# Patient Record
Sex: Male | Born: 1970 | Hispanic: No | Marital: Single | Smoking: Former smoker
Health system: Southern US, Community
[De-identification: ages and names within clinical notes are randomized; demographics above are authoritative.]

## PROBLEM LIST (undated history)

## (undated) DIAGNOSIS — B169 Acute hepatitis B without delta-agent and without hepatic coma: Secondary | ICD-10-CM

## (undated) HISTORY — PX: NASAL SINUS SURGERY: SHX719

## (undated) HISTORY — DX: Acute hepatitis B without delta-agent and without hepatic coma: B16.9

---

## 2000-12-16 ENCOUNTER — Other Ambulatory Visit: Admission: RE | Admit: 2000-12-16 | Discharge: 2000-12-16 | Payer: Self-pay | Admitting: Otolaryngology

## 2004-05-09 ENCOUNTER — Ambulatory Visit: Payer: Self-pay | Admitting: Family Medicine

## 2004-05-14 ENCOUNTER — Ambulatory Visit: Payer: Self-pay | Admitting: Family Medicine

## 2004-05-19 ENCOUNTER — Ambulatory Visit: Payer: Self-pay | Admitting: Family Medicine

## 2004-11-19 ENCOUNTER — Ambulatory Visit: Payer: Self-pay | Admitting: Family Medicine

## 2017-10-19 ENCOUNTER — Encounter: Payer: Self-pay | Admitting: Gastroenterology

## 2017-11-15 ENCOUNTER — Encounter: Payer: Self-pay | Admitting: Gastroenterology

## 2017-11-25 ENCOUNTER — Ambulatory Visit: Payer: Self-pay | Admitting: Gastroenterology

## 2020-04-23 ENCOUNTER — Emergency Department (HOSPITAL_COMMUNITY): Payer: 59

## 2020-04-23 ENCOUNTER — Encounter (HOSPITAL_COMMUNITY): Payer: Self-pay | Admitting: Emergency Medicine

## 2020-04-23 ENCOUNTER — Inpatient Hospital Stay (HOSPITAL_COMMUNITY)
Admission: EM | Admit: 2020-04-23 | Discharge: 2020-05-04 | DRG: 177 | Disposition: A | Discharge status: Home / Self Care | Payer: 59 | Attending: Internal Medicine | Admitting: Internal Medicine

## 2020-04-23 DIAGNOSIS — Z8619 Personal history of other infectious and parasitic diseases: Secondary | ICD-10-CM | POA: Diagnosis not present

## 2020-04-23 DIAGNOSIS — J1282 Pneumonia due to coronavirus disease 2019: Secondary | ICD-10-CM

## 2020-04-23 DIAGNOSIS — A0839 Other viral enteritis: Secondary | ICD-10-CM | POA: Diagnosis present

## 2020-04-23 DIAGNOSIS — E785 Hyperlipidemia, unspecified: Secondary | ICD-10-CM | POA: Diagnosis present

## 2020-04-23 DIAGNOSIS — R410 Disorientation, unspecified: Secondary | ICD-10-CM | POA: Diagnosis not present

## 2020-04-23 DIAGNOSIS — E669 Obesity, unspecified: Secondary | ICD-10-CM | POA: Diagnosis present

## 2020-04-23 DIAGNOSIS — F22 Delusional disorders: Secondary | ICD-10-CM | POA: Diagnosis present

## 2020-04-23 DIAGNOSIS — Z79899 Other long term (current) drug therapy: Secondary | ICD-10-CM

## 2020-04-23 DIAGNOSIS — U071 COVID-19: Principal | ICD-10-CM | POA: Diagnosis present

## 2020-04-23 DIAGNOSIS — E876 Hypokalemia: Secondary | ICD-10-CM | POA: Diagnosis present

## 2020-04-23 DIAGNOSIS — F419 Anxiety disorder, unspecified: Secondary | ICD-10-CM | POA: Diagnosis present

## 2020-04-23 DIAGNOSIS — J9601 Acute respiratory failure with hypoxia: Secondary | ICD-10-CM | POA: Diagnosis present

## 2020-04-23 DIAGNOSIS — I10 Essential (primary) hypertension: Secondary | ICD-10-CM | POA: Diagnosis present

## 2020-04-23 DIAGNOSIS — Z6829 Body mass index (BMI) 29.0-29.9, adult: Secondary | ICD-10-CM | POA: Diagnosis not present

## 2020-04-23 DIAGNOSIS — Z9114 Patient's other noncompliance with medication regimen: Secondary | ICD-10-CM

## 2020-04-23 DIAGNOSIS — R0603 Acute respiratory distress: Secondary | ICD-10-CM

## 2020-04-23 DIAGNOSIS — F29 Unspecified psychosis not due to a substance or known physiological condition: Secondary | ICD-10-CM | POA: Diagnosis not present

## 2020-04-23 DIAGNOSIS — Z5329 Procedure and treatment not carried out because of patient's decision for other reasons: Secondary | ICD-10-CM | POA: Diagnosis not present

## 2020-04-23 DIAGNOSIS — R7989 Other specified abnormal findings of blood chemistry: Secondary | ICD-10-CM | POA: Diagnosis not present

## 2020-04-23 LAB — COMPREHENSIVE METABOLIC PANEL
ALT: 39 U/L (ref 0–44)
AST: 42 U/L — ABNORMAL HIGH (ref 15–41)
Albumin: 3.2 g/dL — ABNORMAL LOW (ref 3.5–5.0)
Alkaline Phosphatase: 52 U/L (ref 38–126)
Anion gap: 15 (ref 5–15)
BUN: 17 mg/dL (ref 6–20)
CO2: 19 mmol/L — ABNORMAL LOW (ref 22–32)
Calcium: 8 mg/dL — ABNORMAL LOW (ref 8.9–10.3)
Chloride: 98 mmol/L (ref 98–111)
Creatinine, Ser: 1.16 mg/dL (ref 0.61–1.24)
GFR, Estimated: >60 mL/min (ref >60)
Glucose, Bld: 176 mg/dL — ABNORMAL HIGH (ref 70–99)
Potassium: 3.3 mmol/L — ABNORMAL LOW (ref 3.5–5.1)
Sodium: 132 mmol/L — ABNORMAL LOW (ref 135–145)
Total Bilirubin: 0.7 mg/dL (ref 0.3–1.2)
Total Protein: 6.4 g/dL — ABNORMAL LOW (ref 6.5–8.1)

## 2020-04-23 LAB — CBC WITH DIFFERENTIAL/PLATELET
Abs Immature Granulocytes: 0.05 10*3/uL (ref 0.00–0.07)
Basophils Absolute: 0 10*3/uL (ref 0.0–0.1)
Basophils Relative: 0 %
Eosinophils Absolute: 0 10*3/uL (ref 0.0–0.5)
Eosinophils Relative: 0 %
HCT: 41.3 % (ref 39.0–52.0)
Hemoglobin: 13.5 g/dL (ref 13.0–17.0)
Immature Granulocytes: 1 %
Lymphocytes Relative: 10 %
Lymphs Abs: 0.8 10*3/uL (ref 0.7–4.0)
MCH: 27.7 pg (ref 26.0–34.0)
MCHC: 32.7 g/dL (ref 30.0–36.0)
MCV: 84.6 fL (ref 80.0–100.0)
Monocytes Absolute: 0.2 10*3/uL (ref 0.1–1.0)
Monocytes Relative: 3 %
Neutro Abs: 6.4 10*3/uL (ref 1.7–7.7)
Neutrophils Relative %: 86 %
Platelets: 151 10*3/uL (ref 150–400)
RBC: 4.88 MIL/uL (ref 4.22–5.81)
RDW: 13.2 % (ref 11.5–15.5)
WBC: 7.4 10*3/uL (ref 4.0–10.5)
nRBC: 0 % (ref 0.0–0.2)

## 2020-04-23 LAB — PROCALCITONIN: Procalcitonin: 0.1 ng/mL

## 2020-04-23 LAB — D-DIMER, QUANTITATIVE: D-Dimer, Quant: 0.46 ug/mL-FEU (ref 0.00–0.50)

## 2020-04-23 LAB — C-REACTIVE PROTEIN: CRP: 6.8 mg/dL — ABNORMAL HIGH (ref <1.0)

## 2020-04-23 LAB — TRIGLYCERIDES: Triglycerides: 186 mg/dL — ABNORMAL HIGH (ref <150)

## 2020-04-23 LAB — LACTIC ACID, PLASMA: Lactic Acid, Venous: 2.9 mmol/L (ref 0.5–1.9)

## 2020-04-23 LAB — FERRITIN: Ferritin: 1089 ng/mL — ABNORMAL HIGH (ref 24–336)

## 2020-04-23 LAB — FIBRINOGEN: Fibrinogen: 557 mg/dL — ABNORMAL HIGH (ref 210–475)

## 2020-04-23 LAB — HIV ANTIBODY (ROUTINE TESTING W REFLEX): HIV Screen 4th Generation wRfx: NONREACTIVE

## 2020-04-23 LAB — LACTATE DEHYDROGENASE: LDH: 322 U/L — ABNORMAL HIGH (ref 98–192)

## 2020-04-23 LAB — POC SARS CORONAVIRUS 2 AG -  ED: SARS Coronavirus 2 Ag: POSITIVE — AB

## 2020-04-23 MED ORDER — METHYLPREDNISOLONE SODIUM SUCC 125 MG IJ SOLR
0.5000 mg/kg | Freq: Two times a day (BID) | INTRAMUSCULAR | Status: AC
  Administered 2020-04-23 – 2020-04-26 (×6): 43.125 mg via INTRAVENOUS
  Filled 2020-04-23 (×5): qty 2

## 2020-04-23 MED ORDER — PREDNISONE 20 MG PO TABS
50.0000 mg | ORAL_TABLET | Freq: Every day | ORAL | Status: DC
  Filled 2020-04-23: qty 2

## 2020-04-23 MED ORDER — DEXAMETHASONE SODIUM PHOSPHATE 10 MG/ML IJ SOLN
10.0000 mg | Freq: Once | INTRAMUSCULAR | Status: DC
  Filled 2020-04-23: qty 1

## 2020-04-23 MED ORDER — BENAZEPRIL HCL 5 MG PO TABS
10.0000 mg | ORAL_TABLET | Freq: Every day | ORAL | Status: DC
  Administered 2020-04-24 – 2020-04-25 (×2): 10 mg via ORAL
  Filled 2020-04-23 (×5): qty 2

## 2020-04-23 MED ORDER — ACETAMINOPHEN 325 MG PO TABS
650.0000 mg | ORAL_TABLET | Freq: Once | ORAL | Status: AC
  Administered 2020-04-23: 650 mg via ORAL
  Filled 2020-04-23: qty 2

## 2020-04-23 MED ORDER — SODIUM CHLORIDE 0.9 % IV SOLN: 100.0000 mg | Freq: Every day | INTRAVENOUS | Status: DC

## 2020-04-23 MED ORDER — GUAIFENESIN-DM 100-10 MG/5ML PO SYRP
10.0000 mL | ORAL_SOLUTION | ORAL | Status: DC | PRN
  Administered 2020-04-27 – 2020-05-04 (×8): 10 mL via ORAL
  Filled 2020-04-23 (×9): qty 10

## 2020-04-23 MED ORDER — ONDANSETRON HCL 4 MG/2ML IJ SOLN
4.0000 mg | Freq: Four times a day (QID) | INTRAMUSCULAR | Status: DC | PRN
  Administered 2020-04-25: 4 mg via INTRAVENOUS
  Filled 2020-04-23: qty 2

## 2020-04-23 MED ORDER — BENZONATATE 100 MG PO CAPS
200.0000 mg | ORAL_CAPSULE | Freq: Once | ORAL | Status: AC
  Administered 2020-04-23: 200 mg via ORAL
  Filled 2020-04-23: qty 2

## 2020-04-23 MED ORDER — HYDROCOD POLST-CPM POLST ER 10-8 MG/5ML PO SUER
5.0000 mL | Freq: Two times a day (BID) | ORAL | Status: DC | PRN
  Administered 2020-04-29 (×2): 5 mL via ORAL
  Filled 2020-04-23 (×2): qty 5

## 2020-04-23 MED ORDER — POTASSIUM CHLORIDE CRYS ER 20 MEQ PO TBCR
40.0000 meq | EXTENDED_RELEASE_TABLET | Freq: Once | ORAL | Status: AC
  Administered 2020-04-23: 40 meq via ORAL
  Filled 2020-04-23: qty 2

## 2020-04-23 MED ORDER — SODIUM CHLORIDE 0.9 % IV SOLN
200.0000 mg | Freq: Once | INTRAVENOUS | Status: DC
  Filled 2020-04-23: qty 40

## 2020-04-23 MED ORDER — SODIUM CHLORIDE 0.9 % IV SOLN: 200.0000 mg | Freq: Once | INTRAVENOUS | Status: DC

## 2020-04-23 MED ORDER — ENOXAPARIN SODIUM 40 MG/0.4ML ~~LOC~~ SOLN
40.0000 mg | SUBCUTANEOUS | Status: DC
  Administered 2020-04-23 – 2020-04-28 (×6): 40 mg via SUBCUTANEOUS
  Filled 2020-04-23 (×5): qty 0.4

## 2020-04-23 MED ORDER — ONDANSETRON HCL 4 MG PO TABS: 4.0000 mg | ORAL_TABLET | Freq: Four times a day (QID) | ORAL | Status: DC | PRN

## 2020-04-23 MED ORDER — ATORVASTATIN CALCIUM 40 MG PO TABS
40.0000 mg | ORAL_TABLET | Freq: Every day | ORAL | Status: DC
  Administered 2020-04-25 – 2020-05-03 (×9): 40 mg via ORAL
  Filled 2020-04-23 (×10): qty 1

## 2020-04-23 MED ORDER — ALBUTEROL SULFATE HFA 108 (90 BASE) MCG/ACT IN AERS
4.0000 | INHALATION_SPRAY | Freq: Once | RESPIRATORY_TRACT | Status: AC
  Administered 2020-04-23: 4 via RESPIRATORY_TRACT
  Filled 2020-04-23: qty 6.7

## 2020-04-23 MED ORDER — SODIUM CHLORIDE 0.9 % IV SOLN
100.0000 mg | Freq: Every day | INTRAVENOUS | Status: DC
  Filled 2020-04-23: qty 20

## 2020-04-23 MED ORDER — ACETAMINOPHEN 325 MG PO TABS
650.0000 mg | ORAL_TABLET | Freq: Four times a day (QID) | ORAL | Status: DC | PRN
  Administered 2020-04-29: 650 mg via ORAL
  Filled 2020-04-23: qty 2

## 2020-04-23 MED ORDER — AMLODIPINE BESYLATE 5 MG PO TABS
5.0000 mg | ORAL_TABLET | Freq: Every day | ORAL | Status: DC
  Administered 2020-04-23 – 2020-05-04 (×12): 5 mg via ORAL
  Filled 2020-04-23 (×12): qty 1

## 2020-04-23 NOTE — ED Notes (Signed)
Monitor and leads replaced, pt placed on cardiac monitor, BP cuff, and pulse ox.

## 2020-04-23 NOTE — ED Notes (Signed)
Decadron and remdesvir prepared and brought to bedside. Patient verbalized that he is not interested in either medication at this time. RN attempted to respond to his concerns. At the end of the conversation, patient encouraged to call wife to see if she has gotten a dose and what her thoughts are. Will let provider know.

## 2020-04-23 NOTE — H&P (Signed)
History and Physical    Frank Richmond:[id number] DOB: 1970-08-26 DOA: 04/23/2020  PCP: Pcp, No  Patient coming from: Home  I have personally briefly reviewed patient's old medical records in [facility]  Chief Complaint: Shortness of breath, cough and fever  HPI: Frank Richmond is a 50 y.o. male with medical history significant of hypertension, hyperlipidemia, remote history of hepatitis B and recent COVID infection presented to ED with a complaint of cough, fatigue, shortness of breath, fevers and chills.  Patient started his symptoms on January 1.  He presented himself to [facility] on January 8 where he was tested positive for COVID-19 and was admitted.  He then left AMA on January 10 due to having paranoid thoughts that he was getting some sort of, medication mixed in the oxygen.  He then started having some diarrhea which started yesterday but improved somewhat today.  He also has some chest pain with a deep breathing.  Per patient, he had CAT scan of the chest done in [facility] and he was ruled out of PE.  Also endorses some chills and sweating as well as some subjective fever at home.  ED Course: Upon arrival to ED here, he was tachycardic, tachypneic and was saturating 86% on room air requiring 2 L of oxygen.  He was once again tested positive for COVID-19.  Lactic acid elevated at 2.9.  Elevated inflammatory markers but unremarkable procalcitonin.  Interestingly, his D-dimer is also within normal range.  Patient was started on steroids and remdesivir and hospital service were consulted to admit the patient for further management.  ED provider is working on getting records from [facility]  Review of Systems: As per HPI otherwise negative.    Past Medical History:  Diagnosis Date  . Acute hepatitis B    s/p resolution (wife from [place] is a carrier)     Past Surgical History:  Procedure Laterality Date  . NASAL SINUS SURGERY       has no history on  file for tobacco use, alcohol use, and drug use.  No Known Allergies  History reviewed. No pertinent family history.  Prior to Admission medications   Medication Sig Start Date End Date Taking? Authorizing Provider  amLODipine (NORVASC) 5 MG tablet Take 5 mg by mouth daily. 03/20/20  Yes [provider]  atorvastatin (LIPITOR) 40 MG tablet Take 40 mg by mouth at bedtime. 03/11/20  Yes [provider]  benazepril (LOTENSIN) 10 MG tablet Take 10 mg by mouth daily. 03/20/20  Yes [provider]    Physical Exam: Vitals:   04/23/20 1615 04/23/20 1630 04/23/20 1645 04/23/20 1700  BP: 120/76 114/75 123/76 128/78  Pulse: (!) 102 89 94 (!) 101  Resp: (!) 29 (!) 24 (!) 24 20  Temp:      TempSrc:      SpO2: 93% 93% 95% 96%  Weight:      Height:        Constitutional: NAD, calm, comfortable Vitals:   04/23/20 1615 04/23/20 1630 04/23/20 1645 04/23/20 1700  BP: 120/76 114/75 123/76 128/78  Pulse: (!) 102 89 94 (!) 101  Resp: (!) 29 (!) 24 (!) 24 20  Temp:      TempSrc:      SpO2: 93% 93% 95% 96%  Weight:      Height:       Eyes: PERRL, lids and conjunctivae normal ENMT: Mucous membranes are moist. Posterior pharynx clear of any exudate or lesions.Normal  dentition.  Neck: normal, supple, no masses, no thyromegaly Respiratory: Diminished breath sounds bilaterally, no wheezing, no crackles. Normal respiratory effort. No accessory muscle use.  Cardiovascular: Regular rate and rhythm, no murmurs / rubs / gallops. No extremity edema. 2+ pedal pulses. No carotid bruits.  Abdomen: no tenderness, no masses palpated. No hepatosplenomegaly. Bowel sounds positive.  Musculoskeletal: no clubbing / cyanosis. No joint deformity upper and lower extremities. Good ROM, no contractures. Normal muscle tone.  Skin: no rashes, lesions, ulcers. No induration Neurologic: CN 2-12 grossly intact. Sensation intact, DTR normal. Strength 5/5 in all 4.  Psychiatric: Normal judgment and  insight. Alert and oriented x 3. Normal mood.    Labs on Admission: I have personally reviewed following labs and imaging studies  CBC: Recent Labs  Lab 04/23/20 1414  WBC 7.4  NEUTROABS 6.4  HGB 13.5  HCT 41.3  MCV 84.6  PLT 151   Basic Metabolic Panel: Recent Labs  Lab 04/23/20 1414  NA 132*  K 3.3*  CL 98  CO2 19*  GLUCOSE 176*  BUN 17  CREATININE 1.16  CALCIUM 8.0*   GFR: Estimated Creatinine Clearance: 79 mL/min (by C-G formula based on SCr of 1.16 mg/dL). Liver Function Tests: Recent Labs  Lab 04/23/20 1414  AST 42*  ALT 39  ALKPHOS 52  BILITOT 0.7  PROT 6.4*  ALBUMIN 3.2*   No results for input(s): LIPASE, AMYLASE in the last 168 hours. No results for input(s): AMMONIA in the last 168 hours. Coagulation Profile: No results for input(s): INR, PROTIME in the last 168 hours. Cardiac Enzymes: No results for input(s): CKTOTAL, CKMB, CKMBINDEX, TROPONINI in the last 168 hours. BNP (last 3 results) No results for input(s): PROBNP in the last 8760 hours. HbA1C: No results for input(s): HGBA1C in the last 72 hours. CBG: No results for input(s): GLUCAP in the last 168 hours. Lipid Profile: Recent Labs    04/23/20 1414  TRIG 186*   Thyroid Function Tests: No results for input(s): TSH, T4TOTAL, FREET4, T3FREE, THYROIDAB in the last 72 hours. Anemia Panel: Recent Labs    04/23/20 1414  FERRITIN 1,089*   Urine analysis: No results found for: COLORURINE, APPEARANCEUR, LABSPEC, PHURINE, GLUCOSEU, HGBUR, BILIRUBINUR, KETONESUR, PROTEINUR, UROBILINOGEN, NITRITE, LEUKOCYTESUR  Radiological Exams on Admission: DG Chest Port 1 View  Result Date: 04/23/2020 CLINICAL DATA:  Shortness of breath COVID Cough EXAM: PORTABLE CHEST 1 VIEW COMPARISON:  04/21/2020 FINDINGS: Cardiomediastinal silhouette and pulmonary vasculature are within normal limits. Patchy bilateral lung opacities have improved since prior examination slight are not completely resolved.  IMPRESSION: Improving bilateral multifocal pneumonia. Electronically Signed   By: Acquanetta Belling M.D.   On: 04/23/2020 13:38    Assessment/Plan Active Problems:   Pneumonia due to COVID-19 virus   Acute hypoxic respiratory failure secondary to COVID-19 pneumonia: We will continue this patient with remdesivir as well as Solu-Medrol, continue oxygen supply to keep oxygen saturation more than 88%,Patient was encouraged to prone, out of bed to chair, to use incentive spirometry and flutter valve.  We will repeat inflammatory markers tomorrow.  The treatment plan and use of medications and known side effects were discussed with patient/family, they were clearly explained that there is no proven definitive treatment for COVID-19 infection, any medications used here are based on published clinical articles/anecdotal data which are not peer-reviewed or randomized control trials.  Complete risks and long-term side effects are unknown, however in the best clinical judgment they seem to be of some clinical benefit rather than medical  risks.  Patient/family agree with the treatment plan and want to receive the given medications.  Hypertension: Blood pressure controlled.  Resume home medications.  Hyperlipidemia: Resume home statin.  COVID management My COVID management  DVT prophylaxis: enoxaparin (LOVENOX) injection 40 mg Start: 04/23/20 1800 Code Status: Full code Family Communication: None present at bedside.  Plan of care discussed with patient in length and he verbalized understanding and agreed with it. Disposition Plan: Home once medically improved Consults called: None Admission status: Inpatient  Hughie Closs MD [facility]  04/23/2020, 6:11 PM  To contact the attending provider between 7A-7P or the covering provider during after hours 7P-7A, please log into the web site [email or web address]

## 2020-04-23 NOTE — ED Notes (Signed)
Covid positive with recent AMA from [facility]. He didn't like the temp of the room, , broken call bell, isolation, and suspects the oxygen was fake that they were giving him. Patient placed on monitors and working call bell within reach. Patient room temp adjusted to his satisfaction and warm blanket provided. Side rails up x 2 and bed in low and locked position.

## 2020-04-23 NOTE — ED Notes (Signed)
No leads or monitor in room. Pt brought to room.

## 2020-04-23 NOTE — ED Provider Notes (Signed)
Care assumed from [facility], please see his note for full details, but in brief patient with known COVID infection with symptoms beginning on 04/13/20, was seen and admitted to [facility] a few days ago and was on oxygen, but reports he did not feel that the oxygen was helping him there, so he decided to leave AMA, but upon returning home symptoms continue to worsen with worsening shortness of breath. On arrival patient found to be hypoxic to 86% at rest. Improved with 2 L O2.  Will require admission for COVID, chest x-ray shows bilateral COVID-pneumonia.  Lab work is pending.  Remdesivir and steroids ordered for patient.  He is currently remaining stable on 2 L nasal cannula.  BP 126/74   Pulse (!) 102   Temp 98.2 F (36.8 C) (Oral)   Resp (!) 29   Ht 5\' 7"  (1.702 m)   Wt 86.2 kg   SpO2 95%   BMI 29.76 kg/m    ED Course/Procedures   Labs Reviewed  LACTIC ACID, PLASMA - Abnormal; Notable for the following components:      Result Value   Lactic Acid, Venous 2.9 (*)    All other components within normal limits  COMPREHENSIVE METABOLIC PANEL - Abnormal; Notable for the following components:   Sodium 132 (*)    Potassium 3.3 (*)    CO2 19 (*)    Glucose, Bld 176 (*)    Calcium 8.0 (*)    Total Protein 6.4 (*)    Albumin 3.2 (*)    AST 42 (*)    All other components within normal limits  LACTATE DEHYDROGENASE - Abnormal; Notable for the following components:   LDH 322 (*)    All other components within normal limits  TRIGLYCERIDES - Abnormal; Notable for the following components:   Triglycerides 186 (*)    All other components within normal limits  FIBRINOGEN - Abnormal; Notable for the following components:   Fibrinogen 557 (*)    All other components within normal limits  POC SARS CORONAVIRUS 2 AG -  ED - Abnormal; Notable for the following components:   SARS Coronavirus 2 Ag POSITIVE (*)    All other components within normal limits  CULTURE, BLOOD (ROUTINE X  2)  CULTURE, BLOOD (ROUTINE X 2)  CBC WITH DIFFERENTIAL/PLATELET  D-DIMER, QUANTITATIVE (NOT AT [facility])  LACTIC ACID, PLASMA  PROCALCITONIN  FERRITIN  C-REACTIVE PROTEIN  POC SARS CORONAVIRUS 2 AG -  ED   DG Chest Port 1 View  Result Date: 04/23/2020 CLINICAL DATA:  Shortness of breath COVID Cough EXAM: PORTABLE CHEST 1 VIEW COMPARISON:  04/21/2020 FINDINGS: Cardiomediastinal silhouette and pulmonary vasculature are within normal limits. Patchy bilateral lung opacities have improved since prior examination slight are not completely resolved. IMPRESSION: Improving bilateral multifocal pneumonia. Electronically Signed   By: 06/19/2020 M.D.   On: 04/23/2020 13:38   EKG Interpretation  Date/Time:  Tuesday April 23 2020 06:59:02 EST Ventricular Rate:  96 PR Interval:  130 QRS Duration: 78 QT Interval:  342 QTC Calculation: 432 R Axis:   47 Text Interpretation: Normal sinus rhythm Normal ECG Confirmed by [id number] (Marianna Fuss) on 04/23/2020 10:15:22 AM   .Critical Care Performed by: 06/21/2020, PA-C Authorized by: Dartha Lodge, PA-C   Critical care provider statement:    Critical care time (minutes):  45   Critical care time was exclusive of:  Teaching time and separately billable procedures and treating other patients   Critical care  was necessary to treat or prevent imminent or life-threatening deterioration of the following conditions:  Respiratory failure (COVID with acute hypoxic respiratory failure)   Critical care was time spent personally by me on the following activities:  Discussions with consultants, evaluation of patient's response to treatment, examination of patient, ordering and performing treatments and interventions, ordering and review of laboratory studies, ordering and review of radiographic studies, pulse oximetry, re-evaluation of patient's condition, obtaining history from patient or surrogate and review of old charts   I assumed direction of critical care  for this patient from another provider in my specialty: yes     Care discussed with: admitting provider      MDM   Patient with acute hypoxic respiratory failure in the setting of known COVID infection, was admitted at [facility] but left AMA, returned today with worsening shortness of breath.  Currently requiring 2 L nasal cannula.  Chest x-ray with bilateral COVID-pneumonia.  Labs show mild hypocalcemia and hypokalemia, normal renal liver function, no leukocytosis or anemia.  Elevation of multiple COVID inflammatory markers.  Patient treated with steroids and remdesivir.  Hospitalist consulted for admission.  Case discussed with Dr. Rene Kocher who will see and admit the patient.  Final diagnoses:  Acute hypoxemic respiratory failure due to COVID-19 [facility])         Dartha Lodge, PA-C 04/23/20 1907    Charlynne Pander, MD 04/23/20 [phone]

## 2020-04-23 NOTE — ED Triage Notes (Addendum)
Pt arrives via EMS from home, diagnosed COVID positive yesterday, c/o SOB and cough. Pt noted home O2 sats in 70s while sleeping. For EMS O2 never dropped below 90% on  RA. Pt was dx with pneumonia yesterday. Left [facility] AMA to come here "because they weren't treating me right". VSS in transport. No distress noted.

## 2020-04-23 NOTE — ED Provider Notes (Signed)
[facility] EMERGENCY DEPARTMENT Provider Note   CSN: [id number] Arrival date & time: 04/23/20  2440     History Chief Complaint  Patient presents with  . Shortness of Breath  . Cough         Frank Richmond is a 50 y.o. male.  HPI Patient is a 50 year old male with a past medical history significant for hypertension and hyperlipidemia remote history of hepatitis B which he states is now resolved. He is present today with shortness of breath, cough, fatigue, malaise, fevers and chills. He was admitted at [facility] he states between January 8-10 and left AMA at 8 PM last night--voiced some paranoid thoughts about the oxygen at [facility] being mixed with something to "put me in a coma".   Now has had several days of diarrhea which he describes as watery. He states it is somewhat improved over the past day. He denies any abdominal pain. He endorses chest pain which is tight and worse with deep breathing. He states that he had a PE study done at [facility] which was negative. He does not have any access to his studies.  He also endorses weakness shortness of breath cough and fatigue and malaise. He feels sweaty sometimes and has had chills at others. He has not measured his temperature today.  No other associated symptoms. No aggravating or mitigating factors.    Past Medical History:  Diagnosis Date  . Acute hepatitis B    s/p resolution (wife from [place] is a carrier)     There are no problems to display for this patient.   Past Surgical History:  Procedure Laterality Date  . NASAL SINUS SURGERY       History reviewed. No pertinent family history.     Home Medications Prior to Admission medications   Medication Sig Start Date End Date Taking? Authorizing Provider  amLODipine (NORVASC) 5 MG tablet Take 5 mg by mouth daily. 03/20/20  Yes [provider]  atorvastatin (LIPITOR) 40 MG tablet Take 40 mg by mouth at bedtime. 03/11/20   Yes [provider]  benazepril (LOTENSIN) 10 MG tablet Take 10 mg by mouth daily. 03/20/20  Yes [provider]    Allergies    Patient has no known allergies.  Review of Systems   Review of Systems  Constitutional: Positive for chills, fatigue and fever.  HENT: Positive for congestion, postnasal drip and rhinorrhea. Negative for sore throat.   Eyes: Negative for redness.  Respiratory: Positive for cough, chest tightness and shortness of breath.   Cardiovascular: Positive for chest pain. Negative for leg swelling.  Gastrointestinal: Positive for diarrhea. Negative for abdominal pain, nausea and vomiting.  Endocrine: Negative for polyphagia.  Genitourinary: Negative for dysuria.  Musculoskeletal: Positive for myalgias.  Skin: Negative for rash.  Neurological: Positive for headaches. Negative for syncope.  Psychiatric/Behavioral: Negative for confusion.    Physical Exam Updated Vital Signs BP 122/79   Pulse (!) 104   Temp 98.2 F (36.8 C) (Oral)   Resp (!) 26   Ht 5\' 7"  (1.702 m)   Wt 86.2 kg   SpO2 93%   BMI 29.76 kg/m   Physical Exam Vitals and nursing note reviewed.  Constitutional:      General: He is not in acute distress.    Appearance: He is obese. He is ill-appearing.     Comments: Obese 50 year old male sitting in bed coughing. Mildly tachypneic mild increased work of breathing. Speaking in full sentences.  HENT:     Head: Normocephalic and atraumatic.     Nose: Nose normal.     Mouth/Throat:     Mouth: Mucous membranes are moist.  Eyes:     General: No scleral icterus. Cardiovascular:     Rate and Rhythm: Normal rate and regular rhythm.     Pulses: Normal pulses.     Heart sounds: Normal heart sounds.  Pulmonary:     Effort: Pulmonary effort is normal. No respiratory distress.     Breath sounds: Wheezing present.     Comments: Faint expiratory wheezing Coarse lung sounds. Tachypnea. Mildly increased work of breathing. Abdominal:      Palpations: Abdomen is soft.     Tenderness: There is no abdominal tenderness. There is no guarding or rebound.     Comments: Protuberant soft abdomen  Musculoskeletal:     Cervical back: Normal range of motion.     Right lower leg: No edema.     Left lower leg: No edema.     Comments: No lower extremity edema. No calf tenderness  Skin:    General: Skin is warm and dry.     Capillary Refill: Capillary refill takes less than 2 seconds.  Neurological:     Mental Status: He is alert. Mental status is at baseline.  Psychiatric:        Mood and Affect: Mood normal.        Behavior: Behavior normal.     ED Results / Procedures / Treatments   Labs (all labs ordered are listed, but only abnormal results are displayed) Labs Reviewed  CULTURE, BLOOD (ROUTINE X 2)  CULTURE, BLOOD (ROUTINE X 2)  LACTIC ACID, PLASMA  LACTIC ACID, PLASMA  CBC WITH DIFFERENTIAL/PLATELET  COMPREHENSIVE METABOLIC PANEL  D-DIMER, QUANTITATIVE (NOT AT [facility])  PROCALCITONIN  LACTATE DEHYDROGENASE  FERRITIN  TRIGLYCERIDES  FIBRINOGEN  C-REACTIVE PROTEIN  POC SARS CORONAVIRUS 2 AG -  ED    EKG EKG Interpretation  Date/Time:  Tuesday April 23 2020 06:59:02 EST Ventricular Rate:  96 PR Interval:  130 QRS Duration: 78 QT Interval:  342 QTC Calculation: 432 R Axis:   47 Text Interpretation: Normal sinus rhythm Normal ECG Confirmed by Marianna Fuss ([id number]) on 04/23/2020 10:15:22 AM   Radiology DG Chest Port 1 View  Result Date: 04/23/2020 CLINICAL DATA:  Shortness of breath COVID Cough EXAM: PORTABLE CHEST 1 VIEW COMPARISON:  04/21/2020 FINDINGS: Cardiomediastinal silhouette and pulmonary vasculature are within normal limits. Patchy bilateral lung opacities have improved since prior examination slight are not completely resolved. IMPRESSION: Improving bilateral multifocal pneumonia. Electronically Signed   By: Acquanetta Belling M.D.   On: 04/23/2020 13:38    Procedures .Critical Care Performed by:  Gailen Shelter, PA Authorized by: Gailen Shelter, PA   Critical care provider statement:    Critical care time (minutes):  35   Critical care time was exclusive of:  Separately billable procedures and treating other patients and teaching time   Critical care was necessary to treat or prevent imminent or life-threatening deterioration of the following conditions:  Respiratory failure   Critical care was time spent personally by me on the following activities:  Discussions with consultants, evaluation of patient's response to treatment, examination of patient, review of old charts, re-evaluation of patient's condition, pulse oximetry, ordering and review of radiographic studies, ordering and review of laboratory studies and ordering and performing treatments and interventions   I assumed direction of critical care for this patient from another provider  in my specialty: no     (including critical care time)  Medications Ordered in ED Medications  dexamethasone (DECADRON) injection 10 mg (has no administration in time range)  remdesivir 200 mg in sodium chloride 0.9% 250 mL IVPB (has no administration in time range)    Followed by  remdesivir 100 mg in sodium chloride 0.9 % 100 mL IVPB (has no administration in time range)  acetaminophen (TYLENOL) tablet 650 mg (650 mg Oral Given 04/23/20 1409)  benzonatate (TESSALON) capsule 200 mg (200 mg Oral Given 04/23/20 1409)  albuterol (VENTOLIN HFA) 108 (90 Base) MCG/ACT inhaler 4 puff (4 puffs Inhalation Given 04/23/20 1411)    ED Course  I have reviewed the triage vital signs and the nursing notes.  Pertinent labs & imaging results that were available during my care of the patient were reviewed by me and considered in my medical decision making (see chart for details).  Patient is 50 year old male with a history of hypertension hyperlipidemia presenting today with COVID symptoms since January 1 tested + January 8 was admitted to [facility]  left AMA presented to ER is mildly hypoxic SPO2 86% on room air at rest on my evaluation placed on 2 L SPO2 93% now still mildly tachypneic and mildly tachycardic.  He is having some mild GI symptoms as well as some diarrhea. Chest x-ray was read to be mildly improved from prior. He has had a PE study done at [facility]. Secretary is still arranging for records to be faxed to our emergency department. As patient is hypoxic he will require hospitalization.  Chest x-ray patchy bilateral lung opacities- noted to be improved from prior studies by radiology.  Patient received one albuterol treatment and is no longer wheezing. Tylenol and Tessalon Perles given. Discussed with pharmacy will order remdesivir and Decadron.  Clinical Course as of 04/23/20 1557  Tue Apr 23, 2020  1252 ED EKG unremarkable. NSR without axis deviation. No ST-T wave abn.  [WF]    Clinical Course User Index [WF] Gailen Shelter, [place]   MDM Rules/Calculators/A&P                          COVID admission labs obtained. Jodi Geralds, PA we will follow-up on these and admit for COVID hypoxia.  Final Clinical Impression(s) / ED Diagnoses Final diagnoses:  COVID-19    Rx / DC Orders ED Discharge Orders    None       Gailen Shelter, [place] 04/23/20 1559    Lorre Nick, MD 04/26/20 [id number]

## 2020-04-24 DIAGNOSIS — U071 COVID-19: Secondary | ICD-10-CM | POA: Diagnosis not present

## 2020-04-24 DIAGNOSIS — J1282 Pneumonia due to coronavirus disease 2019: Secondary | ICD-10-CM | POA: Diagnosis not present

## 2020-04-24 LAB — COMPREHENSIVE METABOLIC PANEL
ALT: 40 U/L (ref 0–44)
AST: 44 U/L — ABNORMAL HIGH (ref 15–41)
Albumin: 3.1 g/dL — ABNORMAL LOW (ref 3.5–5.0)
Alkaline Phosphatase: 54 U/L (ref 38–126)
Anion gap: 12 (ref 5–15)
BUN: 16 mg/dL (ref 6–20)
CO2: 23 mmol/L (ref 22–32)
Calcium: 8.1 mg/dL — ABNORMAL LOW (ref 8.9–10.3)
Chloride: 100 mmol/L (ref 98–111)
Creatinine, Ser: 1.03 mg/dL (ref 0.61–1.24)
GFR, Estimated: >60 mL/min (ref >60)
Glucose, Bld: 165 mg/dL — ABNORMAL HIGH (ref 70–99)
Potassium: 4.2 mmol/L (ref 3.5–5.1)
Sodium: 135 mmol/L (ref 135–145)
Total Bilirubin: 0.7 mg/dL (ref 0.3–1.2)
Total Protein: 6.7 g/dL (ref 6.5–8.1)

## 2020-04-24 LAB — FERRITIN: Ferritin: 1146 ng/mL — ABNORMAL HIGH (ref 24–336)

## 2020-04-24 LAB — CBC WITH DIFFERENTIAL/PLATELET
Abs Immature Granulocytes: 0.05 10*3/uL (ref 0.00–0.07)
Basophils Absolute: 0 10*3/uL (ref 0.0–0.1)
Basophils Relative: 0 %
Eosinophils Absolute: 0 10*3/uL (ref 0.0–0.5)
Eosinophils Relative: 0 %
HCT: 40.9 % (ref 39.0–52.0)
Hemoglobin: 13.7 g/dL (ref 13.0–17.0)
Immature Granulocytes: 1 %
Lymphocytes Relative: 5 %
Lymphs Abs: 0.5 10*3/uL — ABNORMAL LOW (ref 0.7–4.0)
MCH: 28.2 pg (ref 26.0–34.0)
MCHC: 33.5 g/dL (ref 30.0–36.0)
MCV: 84.3 fL (ref 80.0–100.0)
Monocytes Absolute: 0.3 10*3/uL (ref 0.1–1.0)
Monocytes Relative: 4 %
Neutro Abs: 8 10*3/uL — ABNORMAL HIGH (ref 1.7–7.7)
Neutrophils Relative %: 90 %
Platelets: 173 10*3/uL (ref 150–400)
RBC: 4.85 MIL/uL (ref 4.22–5.81)
RDW: 13.2 % (ref 11.5–15.5)
WBC: 8.9 10*3/uL (ref 4.0–10.5)
nRBC: 0 % (ref 0.0–0.2)

## 2020-04-24 LAB — C-REACTIVE PROTEIN: CRP: 8.3 mg/dL — ABNORMAL HIGH (ref <1.0)

## 2020-04-24 LAB — D-DIMER, QUANTITATIVE: D-Dimer, Quant: 0.52 ug/mL-FEU — ABNORMAL HIGH (ref 0.00–0.50)

## 2020-04-24 MED ORDER — SODIUM CHLORIDE 0.9 % IV SOLN
100.0000 mg | Freq: Every day | INTRAVENOUS | Status: AC
  Administered 2020-04-25 – 2020-04-28 (×4): 100 mg via INTRAVENOUS
  Filled 2020-04-24 (×4): qty 20

## 2020-04-24 MED ORDER — SODIUM CHLORIDE 0.9 % IV SOLN
200.0000 mg | Freq: Once | INTRAVENOUS | Status: AC
  Administered 2020-04-24: 200 mg via INTRAVENOUS
  Filled 2020-04-24: qty 40

## 2020-04-24 MED ORDER — SODIUM CHLORIDE 0.9 % IV SOLN: 100.0000 mg | Freq: Every day | INTRAVENOUS | Status: DC

## 2020-04-24 NOTE — Progress Notes (Signed)
Patient voices about concern of sleep apnea. Patient states he has had episodes at home where he stops breathing when he sleeps. Pt doesn't use oxygen or CPAP at home. MD [facility] notified.

## 2020-04-24 NOTE — Progress Notes (Signed)
Attempted to call report to 2W. No answer. Will let night shift nurse know.

## 2020-04-24 NOTE — ED Notes (Signed)
Pt put into teletracking to be transported upstairs

## 2020-04-24 NOTE — Progress Notes (Signed)
Pt voiced concern of not being able to sleep later tonight, MD [facility] notified for sleeping med.

## 2020-04-24 NOTE — Progress Notes (Signed)
Order has been placed for HS CPAP. Informed patient and he is receptive. Respiratory therapy will come and place patient on cpap soon.

## 2020-04-24 NOTE — ED Notes (Signed)
Patient utilized call bell stating he felt like he couldn't breathe, patient noted to have easy unlabored respirations with 02 sat of 88-91%. Patient stated he did not think he was going to live throughout night and did not want to fall asleep because he was afraid he would not wait up. This RN attempted to provide reassurance. Patient stated he did not want to be on a ventilator and was fearful he would die. This RN again attempted to reassure patient.

## 2020-04-24 NOTE — Progress Notes (Addendum)
PROGRESS NOTE    GRANITE GODMAN  HCW:[id number] DOB: [birth year] DOA: 04/23/2020 PCP: Pcp, No   Brief Narrative:  Frank Richmond is a 50 y.o. male with medical history significant of hypertension, hyperlipidemia, remote history of hepatitis B and recent COVID infection presented to ED with a complaint of cough, fatigue, shortness of breath, fevers and chills.  Patient started his symptoms on January 1.  He presented himself to [facility] on January 8 where he was tested positive for COVID-19 and was admitted.  He left that facility AMA on January 10 due to having paranoid thoughts that he was getting some sort of, medication mixed in the oxygen.  He then started having some diarrhea which started yesterday but improved somewhat today.  He also has some chest pain with a deep breathing.  Per patient, he had CAT scan of the chest done in [facility] and he was ruled out of PE.  Also endorses some chills and sweating as well as some subjective fever at home. Upon arrival to ED here, he was tachycardic, tachypneic and was saturating 86% on room air requiring 2 L of oxygen.  He was once again tested positive for COVID-19.  Lactic acid elevated at 2.9.  Elevated inflammatory markers but unremarkable procalcitonin.  Interestingly, his D-dimer is also within normal range.  Patient was started on steroids and remdesivir and hospital service were consulted to admit the patient for further management.  ED provider is working on getting records from [facility].   Assessment & Plan:  Acute hypoxic respiratory failure secondary to COVID-19 pneumonia:  Continue this patient with remdesivir as well as Solu-Medrol Wean oxygen - keep sats 85-91% Continue to prone, out of bed to chair as tolerated, to use incentive spirometry and flutter valve Recent Labs    04/23/20 1414 04/24/20 0327  DDIMER 0.46 0.52*  FERRITIN 1,089* 1,146*  LDH 322*  --   CRP 6.8* 8.3*   Essential HTN Blood pressure  controlled.  Resume home medications.  Hyperlipidemia: Resume home statin.   DVT prophylaxis: Lovenox Code Status: Full Family Communication: None  Status is: Inpt  Dispo: The patient is from: Home              Anticipated d/c is to: Home              Anticipated d/c date is: 72+hours              Patient currently NOT medically stable for discharge  Consultants:   None  Procedures:   None  Antimicrobials:  Remdesivir   Subjective: No acute issues/events overnight- patient very anxious about respiratory status - requesting 'assistant device' which sounds like cpap/bipap despite carrying no history of sleep apnea.   Objective: Vitals:   04/23/20 2345 04/24/20 0200 04/24/20 0415 04/24/20 0615  BP: 109/71 125/74 132/87 132/85  Pulse: (!) 105 (!) 103 (!) 112 (!) 103  Resp: (!) 33 (!) 30 (!) 27 (!) 31  Temp:    98.7 F (37.1 C)  TempSrc:    Oral  SpO2: 91% 91% 93% 91%  Weight:      Height:       No intake or output data in the 24 hours ending 04/24/20 1761 Filed Weights   04/23/20 1414  Weight: 86.2 kg    Examination:  General exam: Appears anxious but in no distress  Respiratory system: Diminished but without wheeze, rales, or rhonchi Cardiovascular system: S1 & S2 heard, RRR. No JVD, murmurs,  rubs, gallops or clicks. No pedal edema. Gastrointestinal system: Abdomen is nondistended, soft and nontender. No organomegaly or masses felt. Normal bowel sounds heard. Central nervous system: Alert and oriented. No focal neurological deficits. Extremities: Symmetric 5 x 5 power. Skin: No rashes, lesions or ulcers  Data Reviewed: I have personally reviewed following labs and imaging studies  CBC: Recent Labs  Lab 04/23/20 1414 04/24/20 0327  WBC 7.4 8.9  NEUTROABS 6.4 8.0*  HGB 13.5 13.7  HCT 41.3 40.9  MCV 84.6 84.3  PLT 151 173   Basic Metabolic Panel: Recent Labs  Lab 04/23/20 1414 04/24/20 0327  NA 132* 135  K 3.3* 4.2  CL 98 100  CO2 19* 23   GLUCOSE 176* 165*  BUN 17 16  CREATININE 1.16 1.03  CALCIUM 8.0* 8.1*   GFR: Estimated Creatinine Clearance: 88.9 mL/min (by C-G formula based on SCr of 1.03 mg/dL). Liver Function Tests: Recent Labs  Lab 04/23/20 1414 04/24/20 0327  AST 42* 44*  ALT 39 40  ALKPHOS 52 54  BILITOT 0.7 0.7  PROT 6.4* 6.7  ALBUMIN 3.2* 3.1*   No results for input(s): LIPASE, AMYLASE in the last 168 hours. No results for input(s): AMMONIA in the last 168 hours. Coagulation Profile: No results for input(s): INR, PROTIME in the last 168 hours. Cardiac Enzymes: No results for input(s): CKTOTAL, CKMB, CKMBINDEX, TROPONINI in the last 168 hours. BNP (last 3 results) No results for input(s): PROBNP in the last 8760 hours. HbA1C: No results for input(s): HGBA1C in the last 72 hours. CBG: No results for input(s): GLUCAP in the last 168 hours. Lipid Profile: Recent Labs    04/23/20 1414  TRIG 186*   Thyroid Function Tests: No results for input(s): TSH, T4TOTAL, FREET4, T3FREE, THYROIDAB in the last 72 hours. Anemia Panel: Recent Labs    04/23/20 1414 04/24/20 0327  FERRITIN 1,089* 1,146*   Sepsis Labs: Recent Labs  Lab 04/23/20 1414 04/23/20 1605  PROCALCITON 0.10  --   LATICACIDVEN  --  2.9*    Recent Results (from the past 240 hour(s))  Blood Culture (routine x 2)     Status: None (Preliminary result)   Collection Time: 04/23/20  4:47 PM   Specimen: BLOOD  Result Value Ref Range Status   Specimen Description BLOOD LEFT ANTECUBITAL  Final   Special Requests   Final    BOTTLES DRAWN AEROBIC AND ANAEROBIC Blood Culture results may not be optimal due to an excessive volume of blood received in culture bottles   Culture   Final    NO GROWTH < 24 HOURS Performed at [facility], [address], [place], [place] [address]    Report Status PENDING  Incomplete  Blood Culture (routine x 2)     Status: None (Preliminary result)   Collection Time: 04/23/20  4:48 PM   Specimen:  BLOOD LEFT FOREARM  Result Value Ref Range Status   Specimen Description BLOOD LEFT FOREARM  Final   Special Requests   Final    BOTTLES DRAWN AEROBIC AND ANAEROBIC Blood Culture adequate volume   Culture   Final    NO GROWTH < 24 HOURS Performed at [facility], [address] [place], [place] [address]    Report Status PENDING  Incomplete         Radiology Studies: DG Chest Port 1 View  Result Date: 04/23/2020 CLINICAL DATA:  Shortness of breath COVID Cough EXAM: PORTABLE CHEST 1 VIEW COMPARISON:  04/21/2020 FINDINGS:  Cardiomediastinal silhouette and pulmonary vasculature are within normal limits. Patchy bilateral lung opacities have improved since prior examination slight are not completely resolved. IMPRESSION: Improving bilateral multifocal pneumonia. Electronically Signed   By: Acquanetta Belling M.D.   On: 04/23/2020 13:38        Scheduled Meds: . amLODipine  5 mg Oral Daily  . atorvastatin  40 mg Oral QHS  . benazepril  10 mg Oral Daily  . dexamethasone (DECADRON) injection  10 mg Intravenous Once  . enoxaparin (LOVENOX) injection  40 mg Subcutaneous Q24H  . methylPREDNISolone (SOLU-MEDROL) injection  0.5 mg/kg Intravenous Q12H   Followed by  . [START ON 04/26/2020] predniSONE  50 mg Oral Daily   Continuous Infusions: . remdesivir 200 mg in sodium chloride 0.9% 250 mL IVPB       LOS: 1 day    Time spent:    Azucena Fallen, DO [facility]  If 7PM-7AM, please contact night-coverage [email or web address]  04/24/2020, 7:22 AM

## 2020-04-24 NOTE — Progress Notes (Signed)
Patient arrived to 2w bed 35, instantly starts to complain about not being able to breathe despite o2 sats being 95 on 5LNC. Patient states he uses a CPAP at night that was donated to him by his brother however he never received an actual sleep study for the therapy. Patient is c/o that he "is not going to make it" tonight if he doesn't get a CPAP because of his undiagnosed but perhaps self-diagnosed sleep apnea.   I have paged physician to address this mans concern.

## 2020-04-24 NOTE — ED Notes (Signed)
MS   Breakfast ordered  

## 2020-04-25 DIAGNOSIS — F29 Unspecified psychosis not due to a substance or known physiological condition: Secondary | ICD-10-CM

## 2020-04-25 DIAGNOSIS — U071 COVID-19: Secondary | ICD-10-CM | POA: Diagnosis not present

## 2020-04-25 DIAGNOSIS — R410 Disorientation, unspecified: Secondary | ICD-10-CM

## 2020-04-25 DIAGNOSIS — J1282 Pneumonia due to coronavirus disease 2019: Secondary | ICD-10-CM | POA: Diagnosis not present

## 2020-04-25 LAB — CBC WITH DIFFERENTIAL/PLATELET
Abs Immature Granulocytes: 0.13 10*3/uL — ABNORMAL HIGH (ref 0.00–0.07)
Basophils Absolute: 0 10*3/uL (ref 0.0–0.1)
Basophils Relative: 0 %
Eosinophils Absolute: 0 10*3/uL (ref 0.0–0.5)
Eosinophils Relative: 0 %
HCT: 42.8 % (ref 39.0–52.0)
Hemoglobin: 14 g/dL (ref 13.0–17.0)
Immature Granulocytes: 1 %
Lymphocytes Relative: 5 %
Lymphs Abs: 0.6 10*3/uL — ABNORMAL LOW (ref 0.7–4.0)
MCH: 27.6 pg (ref 26.0–34.0)
MCHC: 32.7 g/dL (ref 30.0–36.0)
MCV: 84.3 fL (ref 80.0–100.0)
Monocytes Absolute: 0.4 10*3/uL (ref 0.1–1.0)
Monocytes Relative: 3 %
Neutro Abs: 11.1 10*3/uL — ABNORMAL HIGH (ref 1.7–7.7)
Neutrophils Relative %: 91 %
Platelets: 187 10*3/uL (ref 150–400)
RBC: 5.08 MIL/uL (ref 4.22–5.81)
RDW: 12.9 % (ref 11.5–15.5)
WBC: 12.3 10*3/uL — ABNORMAL HIGH (ref 4.0–10.5)
nRBC: 0 % (ref 0.0–0.2)

## 2020-04-25 LAB — COMPREHENSIVE METABOLIC PANEL
ALT: 58 U/L — ABNORMAL HIGH (ref 0–44)
AST: 52 U/L — ABNORMAL HIGH (ref 15–41)
Albumin: 2.7 g/dL — ABNORMAL LOW (ref 3.5–5.0)
Alkaline Phosphatase: 62 U/L (ref 38–126)
Anion gap: 13 (ref 5–15)
BUN: 17 mg/dL (ref 6–20)
CO2: 23 mmol/L (ref 22–32)
Calcium: 8.1 mg/dL — ABNORMAL LOW (ref 8.9–10.3)
Chloride: 97 mmol/L — ABNORMAL LOW (ref 98–111)
Creatinine, Ser: 0.97 mg/dL (ref 0.61–1.24)
GFR, Estimated: >60 mL/min (ref >60)
Glucose, Bld: 186 mg/dL — ABNORMAL HIGH (ref 70–99)
Potassium: 4 mmol/L (ref 3.5–5.1)
Sodium: 133 mmol/L — ABNORMAL LOW (ref 135–145)
Total Bilirubin: 0.8 mg/dL (ref 0.3–1.2)
Total Protein: 6.4 g/dL — ABNORMAL LOW (ref 6.5–8.1)

## 2020-04-25 MED ORDER — QUETIAPINE FUMARATE 25 MG PO TABS
25.0000 mg | ORAL_TABLET | Freq: Two times a day (BID) | ORAL | Status: DC
  Administered 2020-04-25 (×2): 25 mg via ORAL
  Filled 2020-04-25 (×2): qty 1

## 2020-04-25 NOTE — Progress Notes (Addendum)
PROGRESS NOTE    [name]  QAS:[id number] DOB: [birth year] DOA: 04/23/2020 PCP: Pcp, No   Brief Narrative:  Frank Richmond is a 50 y.o. male with medical history significant of hypertension, hyperlipidemia, remote history of hepatitis B and recent COVID infection presented to ED with a complaint of cough, fatigue, shortness of breath, fevers and chills.  Patient started his symptoms on January 1.  He presented himself to [facility] on January 8 where he was tested positive for COVID-19 and was admitted.  He left that facility AMA on January 10 due to having paranoid thoughts that he was getting some sort of, medication mixed in the oxygen.  He then started having some diarrhea which started yesterday but improved somewhat today.  He also has some chest pain with a deep breathing.  Per patient, he had CAT scan of the chest done in [facility] and he was ruled out of PE.  Also endorses some chills and sweating as well as some subjective fever at home. Upon arrival to ED here, he was tachycardic, tachypneic and was saturating 86% on room air requiring 2 L of oxygen.  He was once again tested positive for COVID-19.  Lactic acid elevated at 2.9.  Elevated inflammatory markers but unremarkable procalcitonin.  Interestingly, his D-dimer is also within normal range.  Patient was started on steroids and remdesivir and hospital service were consulted to admit the patient for further management.  ED provider is working on getting records from [facility].   Assessment & Plan:  Acute hypoxic respiratory failure secondary to COVID-19 pneumonia:  Patient refusing Remdesivir treatment this morning, lengthy discussion about limited pharmaceuticals in the setting of prolonged COVID and he subsequently agreed to continue Continue steroids Oxygen somewhat worse over the past 24 hours, likely anxiety driven as below Continue to prone, out of bed to chair as tolerated, to use incentive spirometry  and flutter valve Overnight issues with patient hysterical demanding a CPAP due to respiratory distress, again appears anxiety driven - no history of sleep apnea Inflammatory markers increasing as below Recent Labs    04/23/20 1414 04/24/20 0327  DDIMER 0.46 0.52*  FERRITIN 1,089* 1,146*  LDH 322*  --   CRP 6.8* 8.3*   Noncompliance  Patient this morning refusing Remdesivir as above Lengthy discussion about need for oxygen, patient continues to remove despite ongoing discussion Patient hysterical overnight demanding CPAP despite our discussion previously that he does not qualify for CPAP given no history of sleep apnea, patient also requesting insomnia medications, but then insisting that he can barely stay awake.  Anxiety/paranoia/behavioral disturbances not otherwise specified, POA Multiple episodes of erratic behavior/fixated on his respiratory status since admission Patient left AMA from previous facility due to "additional medications being pushed through the oxygen" per report Psychiatry has been asked to evaluate patient given his severe anxiety, fixation, noncompliance and general behavior disturbances.  Essential HTN Blood pressure controlled.  Resume home medications.  Hyperlipidemia: Resume home statin.   DVT prophylaxis: Lovenox Code Status: Full Family Communication: None  Status is: Inpt  Dispo: The patient is from: Home              Anticipated d/c is to: Home              Anticipated d/c date is: 72+hours              Patient currently NOT medically stable for discharge  Consultants:   None  Procedures:   None  Antimicrobials:  Remdesivir   Subjective: Overnight patient hysterical, stating that he is unable to breathe demanding CPAP despite no diagnosis of sleep apnea.  19 and respiratory did place patient on BiPAP which apparently at some points during the night he refused to wear per signout.  Patient otherwise continues to admit to worsening  dyspnea at rest and with exertion, overtly denies fevers chills nausea vomiting diarrhea constipation or headache..   Objective: Vitals:   04/24/20 2014 04/24/20 2334 04/25/20 0429 04/25/20 0500  BP: 128/76  (!) 142/82   Pulse: (!) 101 100 (!) 102   Resp: 20 (!) 25 20   Temp: 98.1 F (36.7 C)  98.1 F (36.7 C)   TempSrc:      SpO2: (!) 87% 92% (!) 88%   Weight:    85.3 kg  Height:        Intake/Output Summary (Last 24 hours) at 04/25/2020 0741 Last data filed at 04/25/2020 0700 Gross per 24 hour  Intake 240 ml  Output 1825 ml  Net -1585 ml   Filed Weights   04/23/20 1414 04/25/20 0500  Weight: 86.2 kg 85.3 kg    Examination:  General exam: Anxiously sitting up in bed, no acute distress Respiratory system: tachypneic into the 30s - without wheeze, rales, or rhonchi Cardiovascular system: S1 & S2 heard, RRR. No JVD, murmurs, rubs, gallops or clicks. No pedal edema. Gastrointestinal system: Abdomen is nondistended, soft and nontender. No organomegaly or masses felt. Normal bowel sounds heard. Central nervous system: Alert and oriented. No focal neurological deficits. Extremities: Symmetric 5 x 5 power. Skin: No rashes, lesions or ulcers Psych: Anxious, somewhat flight of ideas, rapid speech -concern for capacity, states "I cannot keep living like this"  Data Reviewed: I have personally reviewed following labs and imaging studies  CBC: Recent Labs  Lab 04/23/20 1414 04/24/20 0327 04/25/20 0451  WBC 7.4 8.9 12.3*  NEUTROABS 6.4 8.0* 11.1*  HGB 13.5 13.7 14.0  HCT 41.3 40.9 42.8  MCV 84.6 84.3 84.3  PLT 151 173 187   Basic Metabolic Panel: Recent Labs  Lab 04/23/20 1414 04/24/20 0327 04/25/20 0451  NA 132* 135 133*  K 3.3* 4.2 4.0  CL 98 100 97*  CO2 19* 23 23  GLUCOSE 176* 165* 186*  BUN 17 16 17   CREATININE 1.16 1.03 0.97  CALCIUM 8.0* 8.1* 8.1*   GFR: Estimated Creatinine Clearance: 94 mL/min (by C-G formula based on SCr of 0.97 mg/dL). Liver  Function Tests: Recent Labs  Lab 04/23/20 1414 04/24/20 0327 04/25/20 0451  AST 42* 44* 52*  ALT 39 40 58*  ALKPHOS 52 54 62  BILITOT 0.7 0.7 0.8  PROT 6.4* 6.7 6.4*  ALBUMIN 3.2* 3.1* 2.7*   No results for input(s): LIPASE, AMYLASE in the last 168 hours. No results for input(s): AMMONIA in the last 168 hours. Coagulation Profile: No results for input(s): INR, PROTIME in the last 168 hours. Cardiac Enzymes: No results for input(s): CKTOTAL, CKMB, CKMBINDEX, TROPONINI in the last 168 hours. BNP (last 3 results) No results for input(s): PROBNP in the last 8760 hours. HbA1C: No results for input(s): HGBA1C in the last 72 hours. CBG: No results for input(s): GLUCAP in the last 168 hours. Lipid Profile: Recent Labs    04/23/20 1414  TRIG 186*   Thyroid Function Tests: No results for input(s): TSH, T4TOTAL, FREET4, T3FREE, THYROIDAB in the last 72 hours. Anemia Panel: Recent Labs    04/23/20 1414 04/24/20 0327  FERRITIN 1,089* 1,146*  Sepsis Labs: Recent Labs  Lab 04/23/20 1414 04/23/20 1605  PROCALCITON 0.10  --   LATICACIDVEN  --  2.9*    Recent Results (from the past 240 hour(s))  Blood Culture (routine x 2)     Status: None (Preliminary result)   Collection Time: 04/23/20  4:47 PM   Specimen: BLOOD  Result Value Ref Range Status   Specimen Description BLOOD LEFT ANTECUBITAL  Final   Special Requests   Final    BOTTLES DRAWN AEROBIC AND ANAEROBIC Blood Culture results may not be optimal due to an excessive volume of blood received in culture bottles   Culture   Final    NO GROWTH 2 DAYS Performed at [facility], [address] [place], [place] [address]    Report Status PENDING  Incomplete  Blood Culture (routine x 2)     Status: None (Preliminary result)   Collection Time: 04/23/20  4:48 PM   Specimen: BLOOD LEFT FOREARM  Result Value Ref Range Status   Specimen Description BLOOD LEFT FOREARM  Final   Special Requests   Final    BOTTLES DRAWN  AEROBIC AND ANAEROBIC Blood Culture adequate volume   Culture   Final    NO GROWTH 2 DAYS Performed at [facility], [address], [place], [place] [address]    Report Status PENDING  Incomplete         Radiology Studies: DG Chest Port 1 View  Result Date: 04/23/2020 CLINICAL DATA:  Shortness of breath COVID Cough EXAM: PORTABLE CHEST 1 VIEW COMPARISON:  04/21/2020 FINDINGS: Cardiomediastinal silhouette and pulmonary vasculature are within normal limits. Patchy bilateral lung opacities have improved since prior examination slight are not completely resolved. IMPRESSION: Improving bilateral multifocal pneumonia. Electronically Signed   By: Acquanetta Belling M.D.   On: 04/23/2020 13:38        Scheduled Meds: . amLODipine  5 mg Oral Daily  . atorvastatin  40 mg Oral QHS  . benazepril  10 mg Oral Daily  . dexamethasone (DECADRON) injection  10 mg Intravenous Once  . enoxaparin (LOVENOX) injection  40 mg Subcutaneous Q24H  . methylPREDNISolone (SOLU-MEDROL) injection  0.5 mg/kg Intravenous Q12H   Followed by  . [START ON 04/26/2020] predniSONE  50 mg Oral Daily   Continuous Infusions: . remdesivir 100 mg in NS 100 mL       LOS: 2 days    Time spent:  Azucena Fallen, DO [facility]  If 7PM-7AM, please contact night-coverage [email or web address]  04/25/2020, 7:41 AM

## 2020-04-25 NOTE — Consult Note (Signed)
  Frank Richmond a 49 y.o.malewith medical history significant ofhypertension, hyperlipidemia, remote history of hepatitis B and recent COVID infection presented to ED with a complaint of cough, fatigue, shortness of breath, fevers and chills. Patient started his symptoms on January 1. He presented himself to [facility] on January 8 where he was tested positive for COVID-19 and was admitted. He left that facility AMA on January 10 due to having paranoid thoughts that he was getting some sort of, medication mixed in the oxygen. He then started having some diarrhea which started yesterday but improved somewhat today. He also has some chest pain with a deep breathing. Per patient, he had CAT scan of the chest done in [facility] and he was ruled out of PE. Also endorses some chills and sweating as well as some subjective fever at home. Upon arrival to ED here, he was tachycardic, tachypneic and was saturating 86% on room air requiring 2 L of oxygen. He was once again tested positive for COVID-19. Lactic acid elevated at 2.9. Elevated inflammatory markers but unremarkable procalcitonin. Interestingly, his D-dimer is also within normal range. Patient was started on steroids and remdesivir and hospital service were consulted to admit the patient for further management. ED provider is working on getting records from [facility].  Patient combative overnight requesting treatment for disease he has no diagnosis of (sleep apnea); left previous facility AMA due to reported fear of 'sneaking medication in oxygen' - fixated on all our conversations about his chances of spontaneously stopping breathing. Requested DNR Overnight - unclear if he has capacity/understanding  Patient with some shortness of breath, and difficulty talking for prolonged periods of time. Nursing was going to assist with CPAP trial. She reports some improvement in his behaviors this morning but he remained  paranoid and very anxious.   Suspect patient is developing encephalopathy 2/t Covid 19, as he continues to display confusion, paranoia, and delusions.   WIll start Seroquel 25mg  po BID, may titrate to further target symptoms of psychosis and confusion.   -Unable to evaluate patient at this time, please place consult once patient is medically stable to participate, if psych eval is warranted.

## 2020-04-25 NOTE — Plan of Care (Signed)
  Problem: Education: Goal: Knowledge of risk factors and measures for prevention of condition will improve Outcome: Progressing   

## 2020-04-26 DIAGNOSIS — J1282 Pneumonia due to coronavirus disease 2019: Secondary | ICD-10-CM | POA: Diagnosis not present

## 2020-04-26 DIAGNOSIS — U071 COVID-19: Secondary | ICD-10-CM | POA: Diagnosis not present

## 2020-04-26 DIAGNOSIS — R6889 Other general symptoms and signs: Secondary | ICD-10-CM

## 2020-04-26 LAB — CBC WITH DIFFERENTIAL/PLATELET
Abs Immature Granulocytes: 0.21 10*3/uL — ABNORMAL HIGH (ref 0.00–0.07)
Basophils Absolute: 0 10*3/uL (ref 0.0–0.1)
Basophils Relative: 0 %
Eosinophils Absolute: 0 10*3/uL (ref 0.0–0.5)
Eosinophils Relative: 0 %
HCT: 41.6 % (ref 39.0–52.0)
Hemoglobin: 13.5 g/dL (ref 13.0–17.0)
Immature Granulocytes: 2 %
Lymphocytes Relative: 6 %
Lymphs Abs: 0.7 10*3/uL (ref 0.7–4.0)
MCH: 27.4 pg (ref 26.0–34.0)
MCHC: 32.5 g/dL (ref 30.0–36.0)
MCV: 84.6 fL (ref 80.0–100.0)
Monocytes Absolute: 0.6 10*3/uL (ref 0.1–1.0)
Monocytes Relative: 5 %
Neutro Abs: 10.6 10*3/uL — ABNORMAL HIGH (ref 1.7–7.7)
Neutrophils Relative %: 87 %
Platelets: 214 10*3/uL (ref 150–400)
RBC: 4.92 MIL/uL (ref 4.22–5.81)
RDW: 12.8 % (ref 11.5–15.5)
WBC: 12.1 10*3/uL — ABNORMAL HIGH (ref 4.0–10.5)
nRBC: 0 % (ref 0.0–0.2)

## 2020-04-26 LAB — COMPREHENSIVE METABOLIC PANEL
ALT: 54 U/L — ABNORMAL HIGH (ref 0–44)
AST: 38 U/L (ref 15–41)
Albumin: 2.6 g/dL — ABNORMAL LOW (ref 3.5–5.0)
Alkaline Phosphatase: 61 U/L (ref 38–126)
Anion gap: 12 (ref 5–15)
BUN: 19 mg/dL (ref 6–20)
CO2: 24 mmol/L (ref 22–32)
Calcium: 8.2 mg/dL — ABNORMAL LOW (ref 8.9–10.3)
Chloride: 98 mmol/L (ref 98–111)
Creatinine, Ser: 0.98 mg/dL (ref 0.61–1.24)
GFR, Estimated: >60 mL/min (ref >60)
Glucose, Bld: 200 mg/dL — ABNORMAL HIGH (ref 70–99)
Potassium: 4.4 mmol/L (ref 3.5–5.1)
Sodium: 134 mmol/L — ABNORMAL LOW (ref 135–145)
Total Bilirubin: 1 mg/dL (ref 0.3–1.2)
Total Protein: 6.3 g/dL — ABNORMAL LOW (ref 6.5–8.1)

## 2020-04-26 LAB — D-DIMER, QUANTITATIVE: D-Dimer, Quant: 1.18 ug/mL-FEU — ABNORMAL HIGH (ref 0.00–0.50)

## 2020-04-26 LAB — C-REACTIVE PROTEIN: CRP: 6.6 mg/dL — ABNORMAL HIGH (ref <1.0)

## 2020-04-26 MED ORDER — PREDNISONE 20 MG PO TABS
50.0000 mg | ORAL_TABLET | Freq: Every day | ORAL | Status: DC
  Administered 2020-04-26 – 2020-05-03 (×8): 50 mg via ORAL
  Filled 2020-04-26 (×8): qty 3

## 2020-04-26 MED ORDER — QUETIAPINE FUMARATE 25 MG PO TABS
25.0000 mg | ORAL_TABLET | Freq: Two times a day (BID) | ORAL | Status: DC
  Administered 2020-04-26 (×2): 25 mg via ORAL
  Filled 2020-04-26 (×2): qty 1

## 2020-04-26 NOTE — Progress Notes (Signed)
PROGRESS NOTE    Frank Richmond  KGM:[id number] DOB: 1970/05/13 DOA: 04/23/2020 PCP: Pcp, No   Brief Narrative:  Frank Richmond is a 50 y.o. male with medical history significant of hypertension, hyperlipidemia, remote history of hepatitis B and recent COVID infection presented to ED with a complaint of cough, fatigue, shortness of breath, fevers and chills.  Patient started his symptoms on January 1.  He presented himself to [facility] on January 8 where he was tested positive for COVID-19 and was admitted.  He left that facility AMA on January 10 due to having paranoid thoughts that he was getting some sort of, medication mixed in the oxygen.  He then started having some diarrhea which started yesterday but improved somewhat today.  He also has some chest pain with a deep breathing.  Per patient, he had CAT scan of the chest done in [facility] and he was ruled out of PE.  Also endorses some chills and sweating as well as some subjective fever at home. Upon arrival to ED here, he was tachycardic, tachypneic and was saturating 86% on room air requiring 2 L of oxygen.  He was once again tested positive for COVID-19.  Lactic acid elevated at 2.9.  Elevated inflammatory markers but unremarkable procalcitonin.  Interestingly, his D-dimer is also within normal range.  Patient was started on steroids and remdesivir and hospital service were consulted to admit the patient for further management.  ED provider is working on getting records from [facility].   Assessment & Plan:  Acute hypoxic respiratory failure secondary to COVID-19 pneumonia:  Patient somewhat more compliant with medications today including Remdesivir/steroids Oxygen continues to worsen over the past 48 hours, now on 15 L salter Continue to prone, out of bed to chair as tolerated, to use incentive spirometry and flutter valve Overnight issues with patient now having hallucinations/vivid dreams now refusing CPAP and  demanding BiPAP for treatment of undiagnosed sleep apnea we discussed this was not appropriate Inflammatory markers somewhat labile as below Recent Labs    04/23/20 1414 04/24/20 0327 04/26/20 0403  DDIMER 0.46 0.52* 1.18*  FERRITIN 1,089* 1,146*  --   LDH 322*  --   --   CRP 6.8* 8.3* 6.6*   Noncompliance in the setting of questionable psychiatric illness below Patient hesitant but agreeable this morning again to take his steroids Remdesivir as well as Seroquel Patient now demanding a BiPAP overnight and refusing CPAP which we again discussed was not appropriate given his lack of diagnosis for either of these machines -we discussed at length that if he were having true apneic episodes while sleeping his oxygen sats would likely fluctuate, which they have not.  Anxiety/paranoia/behavioral disturbances not otherwise specified, POA Concern for concurrent COVID delirium/hospital delirium with hallucinations Multiple episodes of erratic behavior/fixated on his respiratory status since admission Patient left AMA from previous facility due to "additional medications being pushed through the oxygen" per report Patient reports hallucinations/vivid dreams overnight seeing people in the room that were not actually there Psychiatry has been asked to evaluate patient - initiated low-dose Seroquel  Essential HTN Continue metoprolol only, patient no longer taking benazepril  Hyperlipidemia: Resume home statin.   DVT prophylaxis: Lovenox Code Status: Full Family Communication: None  Status is: Inpt  Dispo: The patient is from: Home              Anticipated d/c is to: Home              Anticipated  d/c date is: 72+hours              Patient currently NOT medically stable for discharge  Consultants:   None  Procedures:   None  Antimicrobials:  Remdesivir   Subjective: Overnight patient now complaining of visual hallucinations seeing people in the room that were not there, also  complaining of vivid dreams.  Seroquel and steroid were held last night, patient otherwise denies chest pain nausea vomiting diarrhea constipation headache fevers or chills.  He does report ongoing shortness of breath with difficulty taking a deep inspiration and dyspnea with exertion that are somewhat worse than when admitted.  Patient also demanding BiPAP now and refusing to wear his CPAP, continues to be fixated on "stopping breathing when asleep".   Objective: Vitals:   04/26/20 0436 04/26/20 0500 04/26/20 0529 04/26/20 0536  BP: 119/75     Pulse: 96   100  Resp: (!) 24   (!) 26  Temp: 98 F (36.7 C)     TempSrc: Oral     SpO2: (!) 86%  (!) 88% (!) 88%  Weight:  85.2 kg    Height:        Intake/Output Summary (Last 24 hours) at 04/26/2020 0703 Last data filed at 04/26/2020 0300 Gross per 24 hour  Intake 700 ml  Output 1225 ml  Net -525 ml   Filed Weights   04/23/20 1414 04/25/20 0500 04/26/20 0500  Weight: 86.2 kg 85.3 kg 85.2 kg    Examination:  General exam: Anxiously sitting up in bed, no acute distress Respiratory system: tachypneic into the 20s -diminished but without wheeze, rales, or rhonchi Cardiovascular system: S1 & S2 heard, RRR. No JVD, murmurs, rubs, gallops or clicks. No pedal edema. Gastrointestinal system: Abdomen is nondistended, soft and nontender. No organomegaly or masses felt. Normal bowel sounds heard. Central nervous system: Alert and oriented. No focal neurological deficits. Extremities: Symmetric 5 x 5 power. Skin: No rashes, lesions or ulcers Psych: Anxious, somewhat flight of ideas, rapid speech -concern for capacity, states "I cannot keep living like this"  Data Reviewed: I have personally reviewed following labs and imaging studies  CBC: Recent Labs  Lab 04/23/20 1414 04/24/20 0327 04/25/20 0451 04/26/20 0403  WBC 7.4 8.9 12.3* 12.1*  NEUTROABS 6.4 8.0* 11.1* 10.6*  HGB 13.5 13.7 14.0 13.5  HCT 41.3 40.9 42.8 41.6  MCV 84.6 84.3 84.3  84.6  PLT 151 173 187 214   Basic Metabolic Panel: Recent Labs  Lab 04/23/20 1414 04/24/20 0327 04/25/20 0451 04/26/20 0403  NA 132* 135 133* 134*  K 3.3* 4.2 4.0 4.4  CL 98 100 97* 98  CO2 19* 23 23 24   GLUCOSE 176* 165* 186* 200*  BUN 17 16 17 19   CREATININE 1.16 1.03 0.97 0.98  CALCIUM 8.0* 8.1* 8.1* 8.2*   GFR: Estimated Creatinine Clearance: 93 mL/min (by C-G formula based on SCr of 0.98 mg/dL). Liver Function Tests: Recent Labs  Lab 04/23/20 1414 04/24/20 0327 04/25/20 0451 04/26/20 0403  AST 42* 44* 52* 38  ALT 39 40 58* 54*  ALKPHOS 52 54 62 61  BILITOT 0.7 0.7 0.8 1.0  PROT 6.4* 6.7 6.4* 6.3*  ALBUMIN 3.2* 3.1* 2.7* 2.6*   No results for input(s): LIPASE, AMYLASE in the last 168 hours. No results for input(s): AMMONIA in the last 168 hours. Coagulation Profile: No results for input(s): INR, PROTIME in the last 168 hours. Cardiac Enzymes: No results for input(s): CKTOTAL, CKMB, CKMBINDEX, TROPONINI in the  last 168 hours. BNP (last 3 results) No results for input(s): PROBNP in the last 8760 hours. HbA1C: No results for input(s): HGBA1C in the last 72 hours. CBG: No results for input(s): GLUCAP in the last 168 hours. Lipid Profile: Recent Labs    04/23/20 1414  TRIG 186*   Thyroid Function Tests: No results for input(s): TSH, T4TOTAL, FREET4, T3FREE, THYROIDAB in the last 72 hours. Anemia Panel: Recent Labs    04/23/20 1414 04/24/20 0327  FERRITIN 1,089* 1,146*   Sepsis Labs: Recent Labs  Lab 04/23/20 1414 04/23/20 1605  PROCALCITON 0.10  --   LATICACIDVEN  --  2.9*    Recent Results (from the past 240 hour(s))  Blood Culture (routine x 2)     Status: None (Preliminary result)   Collection Time: 04/23/20  4:47 PM   Specimen: BLOOD  Result Value Ref Range Status   Specimen Description BLOOD LEFT ANTECUBITAL  Final   Special Requests   Final    BOTTLES DRAWN AEROBIC AND ANAEROBIC Blood Culture results may not be optimal due to an  excessive volume of blood received in culture bottles   Culture   Final    NO GROWTH 2 DAYS Performed at [facility], [address] [place], [place] [address]    Report Status PENDING  Incomplete  Blood Culture (routine x 2)     Status: None (Preliminary result)   Collection Time: 04/23/20  4:48 PM   Specimen: BLOOD LEFT FOREARM  Result Value Ref Range Status   Specimen Description BLOOD LEFT FOREARM  Final   Special Requests   Final    BOTTLES DRAWN AEROBIC AND ANAEROBIC Blood Culture adequate volume   Culture   Final    NO GROWTH 2 DAYS Performed at [facility], [address] [place], [place] [address]    Report Status PENDING  Incomplete         Radiology Studies: No results found.      Scheduled Meds: . amLODipine  5 mg Oral Daily  . atorvastatin  40 mg Oral QHS  . benazepril  10 mg Oral Daily  . dexamethasone (DECADRON) injection  10 mg Intravenous Once  . enoxaparin (LOVENOX) injection  40 mg Subcutaneous Q24H  . predniSONE  50 mg Oral Daily  . QUEtiapine  25 mg Oral BID   Continuous Infusions: . remdesivir 100 mg in NS 100 mL 100 mg (04/25/20 1341)     LOS: 3 days    Time spent:  Azucena Fallen, DO [facility]  If 7PM-7AM, please contact night-coverage [email or web address]  04/26/2020, 7:03 AM

## 2020-04-26 NOTE — Therapy (Signed)
Called to assess pt for desaturation into the 70's per the nurse. On arrival the pt saturation was 87% on 12L HFNC. Pt assessed with no significant change from pervious assessment. Pt is currently up in the chair. Nurse stated that the patient was having a coughing spell and couldn't maintain saturation after being monitored for a few minutes. Will continue to monitor and wean as tolerated.

## 2020-04-26 NOTE — Progress Notes (Signed)
[facility] night shift MedSurg coverage note.  The patient reported to the staff that he has been having very vivid and scary dreams.  He does not want to close his eyes and has being unable to sleep during the night shift.  He is getting glucocorticoids and was started on quetiapine 25 mg p.o. twice daily late yesterday morning.  I have asked the staff to hold his Seroquel and prednisone until he is evaluated by Dr. Natale Milch this morning.  Sanda Klein, MD

## 2020-04-26 NOTE — Plan of Care (Signed)
  Problem: Education: Goal: Knowledge of risk factors and measures for prevention of condition will improve Outcome: Progressing   Problem: Coping: Goal: Psychosocial and spiritual needs will be supported Outcome: Progressing   Problem: Respiratory: Goal: Will maintain a patent airway Outcome: Progressing Goal: Complications related to the disease process, condition or treatment will be avoided or minimized Outcome: Progressing   

## 2020-04-26 NOTE — Progress Notes (Signed)
Went to give pt. Morning meds, and pt. Keeps talking about having very unusual dreams. Pt. States that he can't sleep because everytime he goes to sleep, he starts dreaming strange un real dreams. Pt. States he does not feel himself. Denies any feelings of self harm or harming any one else. MD notified. Will continue to monitor.

## 2020-04-27 DIAGNOSIS — J1282 Pneumonia due to coronavirus disease 2019: Secondary | ICD-10-CM | POA: Diagnosis not present

## 2020-04-27 DIAGNOSIS — U071 COVID-19: Secondary | ICD-10-CM | POA: Diagnosis not present

## 2020-04-27 LAB — CBC WITH DIFFERENTIAL/PLATELET
Abs Immature Granulocytes: 0.28 10*3/uL — ABNORMAL HIGH (ref 0.00–0.07)
Basophils Absolute: 0 10*3/uL (ref 0.0–0.1)
Basophils Relative: 0 %
Eosinophils Absolute: 0 10*3/uL (ref 0.0–0.5)
Eosinophils Relative: 0 %
HCT: 41.5 % (ref 39.0–52.0)
Hemoglobin: 13.4 g/dL (ref 13.0–17.0)
Immature Granulocytes: 2 %
Lymphocytes Relative: 8 %
Lymphs Abs: 1.1 10*3/uL (ref 0.7–4.0)
MCH: 27.8 pg (ref 26.0–34.0)
MCHC: 32.3 g/dL (ref 30.0–36.0)
MCV: 86.1 fL (ref 80.0–100.0)
Monocytes Absolute: 0.6 10*3/uL (ref 0.1–1.0)
Monocytes Relative: 5 %
Neutro Abs: 10.8 10*3/uL — ABNORMAL HIGH (ref 1.7–7.7)
Neutrophils Relative %: 85 %
Platelets: 241 10*3/uL (ref 150–400)
RBC: 4.82 MIL/uL (ref 4.22–5.81)
RDW: 12.6 % (ref 11.5–15.5)
WBC: 12.8 10*3/uL — ABNORMAL HIGH (ref 4.0–10.5)
nRBC: 0 % (ref 0.0–0.2)

## 2020-04-27 LAB — COMPREHENSIVE METABOLIC PANEL
ALT: 45 U/L — ABNORMAL HIGH (ref 0–44)
AST: 30 U/L (ref 15–41)
Albumin: 2.5 g/dL — ABNORMAL LOW (ref 3.5–5.0)
Alkaline Phosphatase: 64 U/L (ref 38–126)
Anion gap: 12 (ref 5–15)
BUN: 20 mg/dL (ref 6–20)
CO2: 24 mmol/L (ref 22–32)
Calcium: 8.1 mg/dL — ABNORMAL LOW (ref 8.9–10.3)
Chloride: 99 mmol/L (ref 98–111)
Creatinine, Ser: 0.94 mg/dL (ref 0.61–1.24)
GFR, Estimated: >60 mL/min (ref >60)
Glucose, Bld: 146 mg/dL — ABNORMAL HIGH (ref 70–99)
Potassium: 4.2 mmol/L (ref 3.5–5.1)
Sodium: 135 mmol/L (ref 135–145)
Total Bilirubin: 1.1 mg/dL (ref 0.3–1.2)
Total Protein: 5.8 g/dL — ABNORMAL LOW (ref 6.5–8.1)

## 2020-04-27 LAB — C-REACTIVE PROTEIN: CRP: 2.9 mg/dL — ABNORMAL HIGH (ref <1.0)

## 2020-04-27 LAB — D-DIMER, QUANTITATIVE: D-Dimer, Quant: 1.7 ug/mL-FEU — ABNORMAL HIGH (ref 0.00–0.50)

## 2020-04-27 MED ORDER — OLANZAPINE 5 MG PO TBDP
2.5000 mg | ORAL_TABLET | Freq: Two times a day (BID) | ORAL | Status: DC
  Administered 2020-04-27 – 2020-05-04 (×15): 2.5 mg via ORAL
  Filled 2020-04-27 (×17): qty 0.5

## 2020-04-27 MED ORDER — OLANZAPINE 5 MG PO TBDP: 2.5000 mg | ORAL_TABLET | Freq: Every day | ORAL | Status: DC

## 2020-04-27 NOTE — Plan of Care (Signed)
  Problem: Education: Goal: Knowledge of risk factors and measures for prevention of condition will improve Outcome: Progressing   

## 2020-04-27 NOTE — Progress Notes (Signed)
PROGRESS NOTE    Frank Richmond  UJW:[id number] DOB: [birth year] DOA: 04/23/2020 PCP: Pcp, No   Brief Narrative:  Frank Richmond is a 50 y.o. male with medical history significant of hypertension, hyperlipidemia, remote history of hepatitis B and recent COVID infection presented to ED with a complaint of cough, fatigue, shortness of breath, fevers and chills.  Patient started his symptoms on January 1.  He presented himself to [facility] on January 8 where he was tested positive for COVID-19 and was admitted.  He left that facility AMA on January 10 due to having paranoid thoughts that he was getting some sort of, medication mixed in the oxygen.  He then started having some diarrhea which started yesterday but improved somewhat today.  He also has some chest pain with a deep breathing.  Per patient, he had CAT scan of the chest done in [facility] and he was ruled out of PE.  Also endorses some chills and sweating as well as some subjective fever at home. Upon arrival to ED here, he was tachycardic, tachypneic and was saturating 86% on room air requiring 2 L of oxygen.  He was once again tested positive for COVID-19.  Lactic acid elevated at 2.9.  Elevated inflammatory markers but unremarkable procalcitonin.  Interestingly, his D-dimer is also within normal range.  Patient was started on steroids and remdesivir and hospital service were consulted to admit the patient for further management.  ED provider is working on getting records from [facility].   Assessment & Plan:  Acute hypoxic respiratory failure secondary to COVID-19 pneumonia:  Patient somewhat more compliant with medications today including Remdesivir/steroids Oxygen stabilizing over the past 48 hours, now on 15 L salter - sats 88-92% at rest Continued encouragement to prone, out of bed to chair as tolerated, to use incentive spirometry and flutter valve Patient continues to have vivid dreams and hallucinations  overnight, now blaming Seroquel for these issues despite the fact that they have been occurring prior to Seroquel administration  Patient now refusing CPAP and demanding BiPAP for treatment of undiagnosed sleep apnea we discussed this was not appropriate -we will discontinue and remove CPAP from room given patient's refusal and not clinically indicated Inflammatory markers somewhat labile as below Recent Labs    04/26/20 0403 04/27/20 0452  DDIMER 1.18* 1.70*  CRP 6.6* 2.9*   Noncompliance in the setting of questionable psychiatric illness below Patient continues to have episodes of bargaining, noncompliance. Now refusing Seroquel  Patient indicates: "I will only take my amlodipine today" Demanding BiPAP again overnight and this morning refusing CPAP.  We discussed that patient would not be in charge or dictate his respiratory support, discussion between myself and RT about patient's need for oxygen support and does not currently meet any criteria for pressure support Patient continues to be without any apneic or hypoxic events overnight or while sleeping while not using CPAP only further justifying the patient likely does not have sleep apnea -although a formal sleep evaluation in the outpatient setting is certainly reasonable given patient's concerns.  Anxiety/paranoia/behavioral disturbances not otherwise specified, POA Insomnia; Concern for concurrent COVID delirium/hospital delirium with hallucinations Multiple episodes of erratic behavior/fixated on his respiratory status since admission Patient left AMA from previous facility due to "additional medications being pushed through the oxygen" per report Patient reports hallucinations/vivid dreams overnight again after refusing Seroquel Psychiatry has been asked to evaluate patient - initiated low-dose Seroquel (which patient continues to refuse)  Essential HTN Continue metoprolol only,  patient no longer taking  benazepril  Hyperlipidemia:  Resume home statin.   DVT prophylaxis: Lovenox Code Status: Full Family Communication: None, patient to update brother.  Status is: Inpt  Dispo: The patient is from: Home              Anticipated d/c is to: Home              Anticipated d/c date is: 72+hours              Patient currently NOT medically stable for discharge  Consultants:   None  Procedures:   None  Antimicrobials:  Remdesivir   Subjective: Overnight patient continues to be somewhat verbally combative with staff, refusing CPAP demanding BiPAP, noncompliant with nighttime dose of Seroquel or other medications, apparently sleeping very little and still having vivid dreams hallucinations and ongoing anxiety and paranoia that he is going to stop breathing and die overnight in his sleep.  Objective: Vitals:   04/26/20 0751 04/26/20 1342 04/26/20 2214 04/27/20 0500  BP: 109/70 131/79 116/67   Pulse: 85 98 92   Resp: 16 16 20    Temp: 98.7 F (37.1 C) 98.5 F (36.9 C) 98.2 F (36.8 C)   TempSrc:  Oral Oral   SpO2: 91% 90% 91%   Weight:    84.4 kg  Height:        Intake/Output Summary (Last 24 hours) at 04/27/2020 0730 Last data filed at 04/27/2020 0245 Gross per 24 hour  Intake 1080 ml  Output 2325 ml  Net -1245 ml   Filed Weights   04/25/20 0500 04/26/20 0500 04/27/20 0500  Weight: 85.3 kg 85.2 kg 84.4 kg    Examination:  General exam: Comfortably resting in bed, upon entering the room patient becomes markedly tachypneic and anxious appearing. Respiratory system: tachypneic into the 20s -diminished but without wheeze, rales, or rhonchi Cardiovascular system: S1 & S2 heard, RRR. No JVD, murmurs, rubs, gallops or clicks. No pedal edema. Gastrointestinal system: Abdomen is nondistended, soft and nontender. No organomegaly or masses felt. Normal bowel sounds heard. Central nervous system: Alert and oriented. No focal neurological deficits. Extremities: Symmetric 5 x 5  power. Skin: No rashes, lesions or ulcers Psych: Anxious, somewhat flight of ideas, rapid speech  Data Reviewed: I have personally reviewed following labs and imaging studies  CBC: Recent Labs  Lab 04/23/20 1414 04/24/20 0327 04/25/20 0451 04/26/20 0403 04/27/20 0452  WBC 7.4 8.9 12.3* 12.1* 12.8*  NEUTROABS 6.4 8.0* 11.1* 10.6* 10.8*  HGB 13.5 13.7 14.0 13.5 13.4  HCT 41.3 40.9 42.8 41.6 41.5  MCV 84.6 84.3 84.3 84.6 86.1  PLT 151 173 187 214 241   Basic Metabolic Panel: Recent Labs  Lab 04/23/20 1414 04/24/20 0327 04/25/20 0451 04/26/20 0403 04/27/20 0452  NA 132* 135 133* 134* 135  K 3.3* 4.2 4.0 4.4 4.2  CL 98 100 97* 98 99  CO2 19* 23 23 24 24   GLUCOSE 176* 165* 186* 200* 146*  BUN 17 16 17 19 20   CREATININE 1.16 1.03 0.97 0.98 0.94  CALCIUM 8.0* 8.1* 8.1* 8.2* 8.1*   GFR: Estimated Creatinine Clearance: 96.5 mL/min (by C-G formula based on SCr of 0.94 mg/dL). Liver Function Tests: Recent Labs  Lab 04/23/20 1414 04/24/20 0327 04/25/20 0451 04/26/20 0403 04/27/20 0452  AST 42* 44* 52* 38 30  ALT 39 40 58* 54* 45*  ALKPHOS 52 54 62 61 64  BILITOT 0.7 0.7 0.8 1.0 1.1  PROT 6.4* 6.7 6.4* 6.3* 5.8*  ALBUMIN 3.2* 3.1* 2.7* 2.6* 2.5*   No results for input(s): LIPASE, AMYLASE in the last 168 hours. No results for input(s): AMMONIA in the last 168 hours. Coagulation Profile: No results for input(s): INR, PROTIME in the last 168 hours. Cardiac Enzymes: No results for input(s): CKTOTAL, CKMB, CKMBINDEX, TROPONINI in the last 168 hours. BNP (last 3 results) No results for input(s): PROBNP in the last 8760 hours. HbA1C: No results for input(s): HGBA1C in the last 72 hours. CBG: No results for input(s): GLUCAP in the last 168 hours. Lipid Profile: No results for input(s): CHOL, HDL, LDLCALC, TRIG, CHOLHDL, LDLDIRECT in the last 72 hours. Thyroid Function Tests: No results for input(s): TSH, T4TOTAL, FREET4, T3FREE, THYROIDAB in the last 72 hours. Anemia  Panel: No results for input(s): VITAMINB12, FOLATE, FERRITIN, TIBC, IRON, RETICCTPCT in the last 72 hours. Sepsis Labs: Recent Labs  Lab 04/23/20 1414 04/23/20 1605  PROCALCITON 0.10  --   LATICACIDVEN  --  2.9*    Recent Results (from the past 240 hour(s))  Blood Culture (routine x 2)     Status: None (Preliminary result)   Collection Time: 04/23/20  4:47 PM   Specimen: BLOOD  Result Value Ref Range Status   Specimen Description BLOOD LEFT ANTECUBITAL  Final   Special Requests   Final    BOTTLES DRAWN AEROBIC AND ANAEROBIC Blood Culture results may not be optimal due to an excessive volume of blood received in culture bottles   Culture   Final    NO GROWTH 3 DAYS Performed at [facility], [address] [place], [place] [address]    Report Status PENDING  Incomplete  Blood Culture (routine x 2)     Status: None (Preliminary result)   Collection Time: 04/23/20  4:48 PM   Specimen: BLOOD LEFT FOREARM  Result Value Ref Range Status   Specimen Description BLOOD LEFT FOREARM  Final   Special Requests   Final    BOTTLES DRAWN AEROBIC AND ANAEROBIC Blood Culture adequate volume   Culture   Final    NO GROWTH 3 DAYS Performed at [facility], [address], [place], [place] [address]    Report Status PENDING  Incomplete     Radiology Studies: No results found.  Scheduled Meds: . amLODipine  5 mg Oral Daily  . atorvastatin  40 mg Oral QHS  . enoxaparin (LOVENOX) injection  40 mg Subcutaneous Q24H  . predniSONE  50 mg Oral Q breakfast  . QUEtiapine  25 mg Oral BID   Continuous Infusions: . remdesivir 100 mg in NS 100 mL 100 mg (04/26/20 0954)    LOS: 4 days   Time spent:  Azucena Fallen, DO [facility]  If 7PM-7AM, please contact night-coverage [email or web address]  04/27/2020, 7:30 AM

## 2020-04-27 NOTE — Progress Notes (Incomplete)
Pt.'s brother called very upset about the care that the hospital is giving pt. He states that pt. Needs to be on cpap. It was explained that pt. Was refusing cpap. He stated that he was upset that the doctors have not called wit any updates regarding pt., MD informed of this matter.

## 2020-04-27 NOTE — Progress Notes (Signed)
Went to give pt. His morning meds and pt. Was satting in high 70's while on HFNC 15 L. Pt. Placed on non-rebreather. Respiratory was called. Pt. Is currently satting at 96 with 15 L of HFNC and non-rebreather. Pt. Refused seroquel, states the medication is causing him to have bad dreams and he doesn't want to go to sleep. Pt. Seems very anxious and restless. MD notified.

## 2020-04-27 NOTE — Progress Notes (Signed)
Pt.'s brother called very upset and being verbally aggressive towards this nurse, stating that he is not happy with the care the staff/hospital is giving to the pt., and the pt.'s wife, and states that he is not getting updates and that staff is not putting pt. On cpap. He was informed that the pt. Has been refusing cpap. Kept requesting information regarding pt.'s wife, it was explained that we can not disclose information regarding another pt. Due to HIPPA laws. MD made aware of these concerns.

## 2020-04-28 DIAGNOSIS — U071 COVID-19: Secondary | ICD-10-CM | POA: Diagnosis not present

## 2020-04-28 DIAGNOSIS — J1282 Pneumonia due to coronavirus disease 2019: Secondary | ICD-10-CM | POA: Diagnosis not present

## 2020-04-28 LAB — CBC WITH DIFFERENTIAL/PLATELET
Abs Immature Granulocytes: 0.51 10*3/uL — ABNORMAL HIGH (ref 0.00–0.07)
Basophils Absolute: 0.1 10*3/uL (ref 0.0–0.1)
Basophils Relative: 1 %
Eosinophils Absolute: 0 10*3/uL (ref 0.0–0.5)
Eosinophils Relative: 0 %
HCT: 42.9 % (ref 39.0–52.0)
Hemoglobin: 14.3 g/dL (ref 13.0–17.0)
Immature Granulocytes: 4 %
Lymphocytes Relative: 13 %
Lymphs Abs: 1.6 10*3/uL (ref 0.7–4.0)
MCH: 28.2 pg (ref 26.0–34.0)
MCHC: 33.3 g/dL (ref 30.0–36.0)
MCV: 84.6 fL (ref 80.0–100.0)
Monocytes Absolute: 0.3 10*3/uL (ref 0.1–1.0)
Monocytes Relative: 3 %
Neutro Abs: 9.6 10*3/uL — ABNORMAL HIGH (ref 1.7–7.7)
Neutrophils Relative %: 79 %
Platelets: 293 10*3/uL (ref 150–400)
RBC: 5.07 MIL/uL (ref 4.22–5.81)
RDW: 12.9 % (ref 11.5–15.5)
WBC: 12 10*3/uL — ABNORMAL HIGH (ref 4.0–10.5)
nRBC: 0 % (ref 0.0–0.2)

## 2020-04-28 LAB — COMPREHENSIVE METABOLIC PANEL
ALT: 42 U/L (ref 0–44)
AST: 30 U/L (ref 15–41)
Albumin: 2.5 g/dL — ABNORMAL LOW (ref 3.5–5.0)
Alkaline Phosphatase: 70 U/L (ref 38–126)
Anion gap: 11 (ref 5–15)
BUN: 17 mg/dL (ref 6–20)
CO2: 25 mmol/L (ref 22–32)
Calcium: 7.9 mg/dL — ABNORMAL LOW (ref 8.9–10.3)
Chloride: 98 mmol/L (ref 98–111)
Creatinine, Ser: 1.14 mg/dL (ref 0.61–1.24)
GFR, Estimated: >60 mL/min (ref >60)
Glucose, Bld: 117 mg/dL — ABNORMAL HIGH (ref 70–99)
Potassium: 4 mmol/L (ref 3.5–5.1)
Sodium: 134 mmol/L — ABNORMAL LOW (ref 135–145)
Total Bilirubin: 1.1 mg/dL (ref 0.3–1.2)
Total Protein: 6.1 g/dL — ABNORMAL LOW (ref 6.5–8.1)

## 2020-04-28 LAB — CULTURE, BLOOD (ROUTINE X 2)
Culture: NO GROWTH
Culture: NO GROWTH
Special Requests: ADEQUATE

## 2020-04-28 LAB — D-DIMER, QUANTITATIVE: D-Dimer, Quant: 5.84 ug/mL-FEU — ABNORMAL HIGH (ref 0.00–0.50)

## 2020-04-28 LAB — C-REACTIVE PROTEIN: CRP: 7.3 mg/dL — ABNORMAL HIGH (ref <1.0)

## 2020-04-28 NOTE — Plan of Care (Signed)
  Problem: Education: Goal: Knowledge of risk factors and measures for prevention of condition will improve Outcome: Progressing   

## 2020-04-28 NOTE — Progress Notes (Signed)
PROGRESS NOTE    Frank Richmond  QIH:[id number] DOB: 10-Aug-1970 DOA: 04/23/2020 PCP: Pcp, No   Brief Narrative:  Frank Richmond is a 50 y.o. male with medical history significant of hypertension, hyperlipidemia, remote history of hepatitis B and recent COVID infection presented to ED with a complaint of cough, fatigue, shortness of breath, fevers and chills.  Patient started his symptoms on January 1.  He presented himself to [facility] on January 8 where he was tested positive for COVID-19 and was admitted.  He left that facility AMA on January 10 due to having paranoid thoughts that he was getting some sort of, medication mixed in the oxygen.  He then started having some diarrhea which started yesterday but improved somewhat today.  He also has some chest pain with a deep breathing.  Per patient, he had CAT scan of the chest done in [facility] and he was ruled out of PE.  Also endorses some chills and sweating as well as some subjective fever at home. Upon arrival to ED here, he was tachycardic, tachypneic and was saturating 86% on room air requiring 2 L of oxygen.  He was once again tested positive for COVID-19.  Lactic acid elevated at 2.9.  Elevated inflammatory markers but unremarkable procalcitonin.  Interestingly, his D-dimer is also within normal range.  Patient was started on steroids and remdesivir and hospital service were consulted to admit the patient for further management.  ED provider is working on getting records from [facility].   Assessment & Plan:  Acute hypoxic respiratory failure secondary to COVID-19 pneumonia:  - Continue steroid taper - Completed Remdesivir 04/28/2020 - Oxygen stabilizing over the past 48 hours, now on 15 L salter - sats 88-92% at rest - Continued encouragement to prone, out of bed to chair as tolerated, to use incentive spirometry and flutter valve - Patient's overnight behavior hallucinations and vivid dreams appear to be resolving,  continue - Zyprexa, Seroquel previously discontinued -appreciate psych insight and recommendations - Inflammatory markers somewhat labile as below Recent Labs    04/26/20 0403 04/27/20 0452 04/28/20 0447  DDIMER 1.18* 1.70* 5.84*  CRP 6.6* 2.9* 7.3*   Noncompliance in the setting of questionable psychiatric illness below - Patient continues to have episodes of bargaining, noncompliance; generally improving. - Now refusing Seroquel -transition to Zyprexa per psych - Patient continues to be without any apneic or hypoxic events overnight or while sleeping while not using CPAP only further justifying the patient likely does not have sleep apnea -although a formal sleep evaluation in the outpatient setting is certainly reasonable given patient's concerns.  Anxiety/paranoia/behavioral disturbances not otherwise specified, POA Insomnia; Concern for concurrent COVID delirium/hospital delirium with hallucinations - Multiple episodes of erratic behavior/fixated on his respiratory status since admission - Patient left AMA from previous facility due to "additional medications being pushed through the oxygen" per report  - Psychiatry following, patient continued to refuse Seroquel, have transition to Zyprexa with moderate improvement in symptoms  Essential HTN Continue metoprolol only, patient no longer taking benazepril  Hyperlipidemia:  Resume home statin.   DVT prophylaxis: Lovenox Code Status: Full Family Communication: None, patient to update brother.  Status is: Inpt  Dispo: The patient is from: Home              Anticipated d/c is to: Home              Anticipated d/c date is: 72+hours  Patient currently NOT medically stable for discharge  Consultants:   None  Procedures:   None  Antimicrobials:  Remdesivir   Subjective: Patient had no episodes of behavioral outbursts, hallucinations or reported vivid dreams overnight.  Slept much better than previous.   Otherwise continues to focus on shortness of breath and dyspnea with exertion but denies nausea vomiting diarrhea constipation headache fevers or chills.  Objective: Vitals:   04/27/20 1050 04/27/20 1412 04/27/20 2020 04/27/20 2216  BP:  122/70  123/61  Pulse:  96  87  Resp:  20    Temp:  98.7 F (37.1 C)  98 F (36.7 C)  TempSrc:  Oral    SpO2: 92% (!) 89% (!) 88% 100%  Weight:      Height:        Intake/Output Summary (Last 24 hours) at 04/28/2020 0645 Last data filed at 04/27/2020 1536 Gross per 24 hour  Intake --  Output 250 ml  Net -250 ml   Filed Weights   04/25/20 0500 04/26/20 0500 04/27/20 0500  Weight: 85.3 kg 85.2 kg 84.4 kg    Examination:  General: Sitting at bedside chair, No acute distress.  Much more calm and appropriate today HEENT:  Normocephalic atraumatic.  Sclerae nonicteric, noninjected.  Extraocular movements intact bilaterally. Neck:  Without mass or deformity.  Trachea is midline. Lungs: Diminished diffusely without rhonchi, wheeze, or rales. Heart:  Regular rate and rhythm.  Without murmurs, rubs, or gallops. Abdomen:  Soft, nontender, nondistended.  Without guarding or rebound. Extremities: Without cyanosis, clubbing, edema, or obvious deformity. Vascular:  Dorsalis pedis and posterior tibial pulses palpable bilaterally. Skin:  Warm and dry, no erythema, no ulcerations.   Data Reviewed: I have personally reviewed following labs and imaging studies  CBC: Recent Labs  Lab 04/24/20 0327 04/25/20 0451 04/26/20 0403 04/27/20 0452 04/28/20 0447  WBC 8.9 12.3* 12.1* 12.8* 12.0*  NEUTROABS 8.0* 11.1* 10.6* 10.8* 9.6*  HGB 13.7 14.0 13.5 13.4 14.3  HCT 40.9 42.8 41.6 41.5 42.9  MCV 84.3 84.3 84.6 86.1 84.6  PLT 173 187 214 241 293   Basic Metabolic Panel: Recent Labs  Lab 04/24/20 0327 04/25/20 0451 04/26/20 0403 04/27/20 0452 04/28/20 0447  NA 135 133* 134* 135 134*  K 4.2 4.0 4.4 4.2 4.0  CL 100 97* 98 99 98  CO2 23 23 24 24 25    GLUCOSE 165* 186* 200* 146* 117*  BUN 16 17 19 20 17   CREATININE 1.03 0.97 0.98 0.94 1.14  CALCIUM 8.1* 8.1* 8.2* 8.1* 7.9*   GFR: Estimated Creatinine Clearance: 79.6 mL/min (by C-G formula based on SCr of 1.14 mg/dL). Liver Function Tests: Recent Labs  Lab 04/24/20 0327 04/25/20 0451 04/26/20 0403 04/27/20 0452 04/28/20 0447  AST 44* 52* 38 30 30  ALT 40 58* 54* 45* 42  ALKPHOS 54 62 61 64 70  BILITOT 0.7 0.8 1.0 1.1 1.1  PROT 6.7 6.4* 6.3* 5.8* 6.1*  ALBUMIN 3.1* 2.7* 2.6* 2.5* 2.5*   No results for input(s): LIPASE, AMYLASE in the last 168 hours. No results for input(s): AMMONIA in the last 168 hours. Coagulation Profile: No results for input(s): INR, PROTIME in the last 168 hours. Cardiac Enzymes: No results for input(s): CKTOTAL, CKMB, CKMBINDEX, TROPONINI in the last 168 hours. BNP (last 3 results) No results for input(s): PROBNP in the last 8760 hours. HbA1C: No results for input(s): HGBA1C in the last 72 hours. CBG: No results for input(s): GLUCAP in the last 168 hours. Lipid Profile:  No results for input(s): CHOL, HDL, LDLCALC, TRIG, CHOLHDL, LDLDIRECT in the last 72 hours. Thyroid Function Tests: No results for input(s): TSH, T4TOTAL, FREET4, T3FREE, THYROIDAB in the last 72 hours. Anemia Panel: No results for input(s): VITAMINB12, FOLATE, FERRITIN, TIBC, IRON, RETICCTPCT in the last 72 hours. Sepsis Labs: Recent Labs  Lab 04/23/20 1414 04/23/20 1605  PROCALCITON 0.10  --   LATICACIDVEN  --  2.9*    Recent Results (from the past 240 hour(s))  Blood Culture (routine x 2)     Status: None (Preliminary result)   Collection Time: 04/23/20  4:47 PM   Specimen: BLOOD  Result Value Ref Range Status   Specimen Description BLOOD LEFT ANTECUBITAL  Final   Special Requests   Final    BOTTLES DRAWN AEROBIC AND ANAEROBIC Blood Culture results may not be optimal due to an excessive volume of blood received in culture bottles   Culture   Final    NO GROWTH 4  DAYS Performed at [facility], [address], [place], [place] [address]    Report Status PENDING  Incomplete  Blood Culture (routine x 2)     Status: None (Preliminary result)   Collection Time: 04/23/20  4:48 PM   Specimen: BLOOD LEFT FOREARM  Result Value Ref Range Status   Specimen Description BLOOD LEFT FOREARM  Final   Special Requests   Final    BOTTLES DRAWN AEROBIC AND ANAEROBIC Blood Culture adequate volume   Culture   Final    NO GROWTH 4 DAYS Performed at [facility], [address] [place], [place] [address]    Report Status PENDING  Incomplete     Radiology Studies: No results found.  Scheduled Meds: . amLODipine  5 mg Oral Daily  . atorvastatin  40 mg Oral QHS  . enoxaparin (LOVENOX) injection  40 mg Subcutaneous Q24H  . OLANZapine zydis  2.5 mg Oral BID  . predniSONE  50 mg Oral Q breakfast   Continuous Infusions: . remdesivir 100 mg in NS 100 mL 100 mg (04/27/20 1000)    LOS: 5 days   Time spent:  Azucena Fallen, DO [facility]  If 7PM-7AM, please contact night-coverage [email or web address]  04/28/2020, 6:45 AM

## 2020-04-29 ENCOUNTER — Inpatient Hospital Stay (HOSPITAL_COMMUNITY): Payer: 59

## 2020-04-29 DIAGNOSIS — U071 COVID-19: Secondary | ICD-10-CM | POA: Diagnosis not present

## 2020-04-29 DIAGNOSIS — J1282 Pneumonia due to coronavirus disease 2019: Secondary | ICD-10-CM | POA: Diagnosis not present

## 2020-04-29 LAB — COMPREHENSIVE METABOLIC PANEL
ALT: 34 U/L (ref 0–44)
AST: 27 U/L (ref 15–41)
Albumin: 2.3 g/dL — ABNORMAL LOW (ref 3.5–5.0)
Alkaline Phosphatase: 59 U/L (ref 38–126)
Anion gap: 10 (ref 5–15)
BUN: 15 mg/dL (ref 6–20)
CO2: 24 mmol/L (ref 22–32)
Calcium: 7.8 mg/dL — ABNORMAL LOW (ref 8.9–10.3)
Chloride: 103 mmol/L (ref 98–111)
Creatinine, Ser: 0.96 mg/dL (ref 0.61–1.24)
GFR, Estimated: >60 mL/min (ref >60)
Glucose, Bld: 122 mg/dL — ABNORMAL HIGH (ref 70–99)
Potassium: 4.5 mmol/L (ref 3.5–5.1)
Sodium: 137 mmol/L (ref 135–145)
Total Bilirubin: 0.8 mg/dL (ref 0.3–1.2)
Total Protein: 5.8 g/dL — ABNORMAL LOW (ref 6.5–8.1)

## 2020-04-29 LAB — CBC
HCT: 37.8 % — ABNORMAL LOW (ref 39.0–52.0)
Hemoglobin: 12.9 g/dL — ABNORMAL LOW (ref 13.0–17.0)
MCH: 28.4 pg (ref 26.0–34.0)
MCHC: 34.1 g/dL (ref 30.0–36.0)
MCV: 83.1 fL (ref 80.0–100.0)
Platelets: 249 10*3/uL (ref 150–400)
RBC: 4.55 MIL/uL (ref 4.22–5.81)
RDW: 12.7 % (ref 11.5–15.5)
WBC: 10.5 10*3/uL (ref 4.0–10.5)
nRBC: 0 % (ref 0.0–0.2)

## 2020-04-29 LAB — BLOOD GAS, ARTERIAL
Acid-Base Excess: 2.2 mmol/L — ABNORMAL HIGH (ref 0.0–2.0)
Bicarbonate: 25.6 mmol/L (ref 20.0–28.0)
FIO2: 68
O2 Saturation: 92.2 %
Patient temperature: 36.7
pCO2 arterial: 34.8 mmHg (ref 32.0–48.0)
pH, Arterial: 7.479 — ABNORMAL HIGH (ref 7.350–7.450)
pO2, Arterial: 63.3 mmHg — ABNORMAL LOW (ref 83.0–108.0)

## 2020-04-29 LAB — PROCALCITONIN: Procalcitonin: <0.1 ng/mL

## 2020-04-29 LAB — C-REACTIVE PROTEIN: CRP: 15.7 mg/dL — ABNORMAL HIGH (ref <1.0)

## 2020-04-29 LAB — D-DIMER, QUANTITATIVE: D-Dimer, Quant: 10.09 ug/mL-FEU — ABNORMAL HIGH (ref 0.00–0.50)

## 2020-04-29 MED ORDER — IOHEXOL 350 MG/ML SOLN
75.0000 mL | Freq: Once | INTRAVENOUS | Status: AC | PRN
  Administered 2020-04-29: 75 mL via INTRAVENOUS

## 2020-04-29 MED ORDER — ENOXAPARIN SODIUM 100 MG/ML ~~LOC~~ SOLN
1.0000 mg/kg | Freq: Two times a day (BID) | SUBCUTANEOUS | Status: DC
  Administered 2020-04-29 – 2020-04-30 (×2): 82.5 mg via SUBCUTANEOUS
  Filled 2020-04-29 (×2): qty 1

## 2020-04-29 NOTE — Progress Notes (Addendum)
HOSPITAL MEDICINE OVERNIGHT EVENT NOTE    Notified by nursing that patient has progressively begun to complain of more and more shortness of breath in the past several hours.  Patient is currently requiring salter high flow combined with nonrebreather mask.  Chart reviewed, patient is being treated for COVID-19 pneumonia with acute hypoxic respiratory failure.  While short of breath, patient is awake alert and oriented x3 and hemodynamically stable.  Labs reveal that patient is exhibiting a concerning jump in CRP from 7.3 yesterday to 15.7 this morning.  Additionally, D-dimer has doubled from 5.84 yesterday to 10.09 today.  Obtaining stat ABG and chest x-ray for now.  Based on these results we will consider whether obtaining stat CTA chest is warranted especially in light of increase in D-dimer.  Marinda Elk  MD [facility]   ADDENDUM 1/17 5AM  Chest x-ray reveals substantial progression of bilateral infiltrates.  I therefore added on a procalcitonin which remains less than 0.10.  I also obtained an ABG which reveals a pH of 7.47, PCO2 of 34.8 and PaO2 of 63.3 on salter high flow combined with nonrebreather mask.  I believe patient's increasing oxygen requirement is due to worsening COVID-19 viral pneumonia.  D-dimer has exhibited a substantial increase I do not believe the patient is having superimposed thromboembolism at this time.  If patient continues to clinically worsen CT angiogram of the chest needs to be reconsidered.  Deno Lunger Adonis Yim

## 2020-04-29 NOTE — Progress Notes (Signed)
PROGRESS NOTE    Frank Richmond  JME:[id number] DOB: 03/07/1971 DOA: 04/23/2020 PCP: Pcp, No   Brief Narrative:  Frank Richmond is a 50 y.o. male with medical history significant of hypertension, hyperlipidemia, remote history of hepatitis B and recent COVID infection presented to ED with a complaint of cough, fatigue, shortness of breath, fevers and chills.  Patient started his symptoms on January 1.  He presented himself to [facility] on January 8 where he was tested positive for COVID-19 and was admitted.  He left that facility AMA on January 10 due to having paranoid thoughts that he was getting some sort of, medication mixed in the oxygen.  He then started having some diarrhea which started yesterday but improved somewhat today.  He also has some chest pain with a deep breathing.  Per patient, he had CAT scan of the chest done in [facility] and he was ruled out of PE.  Also endorses some chills and sweating as well as some subjective fever at home. Upon arrival to ED here, he was tachycardic, tachypneic and was saturating 86% on room air requiring 2 L of oxygen.  He was once again tested positive for COVID-19.  Lactic acid elevated at 2.9.  Elevated inflammatory markers but unremarkable procalcitonin.  Interestingly, his D-dimer is also within normal range.  Patient was started on steroids and remdesivir and hospital service were consulted to admit the patient for further management.  ED provider is working on getting records from [facility].   Assessment & Plan:  Acute hypoxic respiratory failure secondary to COVID-19 pneumonia Rule out acute PE:  - Continue steroid taper - Completed Remdesivir 04/28/2020 - Inflammatory markers increasing - Dimer acutely elevated with clinically worsening respiratory status - start full dose lovenox for presumed PE - CTA pending to evaluate for presumed PE - Oxygen waxing/waning over the past 48 hours SpO2: 92 % O2 Flow Rate (L/min): 12  L/min FiO2 (%): 75 % - Continued encouragement to prone, out of bed to chair as tolerated, to use incentive spirometry and flutter valve - Patient's overnight behavior hallucinations and vivid dreams appear to be resolving, continue - Zyprexa, Seroquel previously discontinued -appreciate psych insight and recommendations Recent Labs    04/27/20 0452 04/28/20 0447 04/29/20 0122  DDIMER 1.70* 5.84* 10.09*  CRP 2.9* 7.3* 15.7*   Noncompliance in the setting of questionable psychiatric illness below - Patient continues to have episodes of bargaining, noncompliance; generally improving. - Now refusing Seroquel -transition to Zyprexa per psych - Patient continues to be without any apneic or hypoxic events overnight or while sleeping while not using CPAP only further justifying the patient likely does not have sleep apnea -although a formal sleep evaluation in the outpatient setting is certainly reasonable given patient's concerns.  Anxiety/paranoia/behavioral disturbances not otherwise specified, POA Insomnia; Concern for concurrent COVID delirium/hospital delirium with hallucinations - Multiple episodes of erratic behavior/fixated on his respiratory status since admission - Patient left AMA from previous facility due to "additional medications being pushed through the oxygen" per report  - Psychiatry following, patient continued to refuse Seroquel, have transition to Zyprexa with moderate improvement in symptoms  Essential HTN Continue metoprolol only, patient no longer taking benazepril  Hyperlipidemia:  Resume home statin.   DVT prophylaxis: Lovenox Code Status: Full Family Communication: Brother updated at length - we discussed patient's current medication regimen and disposition. He requested we look into hydroxychloroquine, melatonin, and other medications. I explained that I would be happy to look through  any peer-reviewed medication trials if he would provide the article name and  journal it could be found in.  Status is: Inpt  Dispo: The patient is from: Home              Anticipated d/c is to: Home              Anticipated d/c date is: 72+hours              Patient currently NOT medically stable for discharge  Consultants:   None  Procedures:   None  Antimicrobials:  Remdesivir   Subjective: Patient had worsening respiratory status overnight - both clinically and requiring NRB to maintain sats. Denies chest pain, headache, fevers, chills, nausea, or vomiting.  Objective: Vitals:   04/28/20 1520 04/28/20 2213 04/29/20 0439 04/29/20 0531  BP:  (!) 107/58  103/61  Pulse:  87  89  Resp:  19  20  Temp:  98 F (36.7 C)  97.8 F (36.6 C)  TempSrc:  Oral  Oral  SpO2: (!) 86% 93% 91% 92%  Weight:      Height:        Intake/Output Summary (Last 24 hours) at 04/29/2020 0617 Last data filed at 04/28/2020 2200 Gross per 24 hour  Intake --  Output 700 ml  Net -700 ml   Filed Weights   04/26/20 0500 04/27/20 0500 04/28/20 0500  Weight: 85.2 kg 84.4 kg 83.4 kg    Examination:  General: Sitting at bedside chair, No acute distress.  Much more calm and appropriate today HEENT:  Normocephalic atraumatic.  Sclerae nonicteric, noninjected.  Extraocular movements intact bilaterally. Neck:  Without mass or deformity.  Trachea is midline. Lungs: Diminished diffusely without rhonchi, wheeze, or rales. Moderately tachypneic. Heart:  Regular rate and rhythm.  Without murmurs, rubs, or gallops. Abdomen:  Soft, nontender, nondistended.  Without guarding or rebound. Extremities: Without cyanosis, clubbing, edema, or obvious deformity. Vascular:  Dorsalis pedis and posterior tibial pulses palpable bilaterally. Skin:  Warm and dry, no erythema, no ulcerations.  Data Reviewed: I have personally reviewed following labs and imaging studies  CBC: Recent Labs  Lab 04/24/20 0327 04/25/20 0451 04/26/20 0403 04/27/20 0452 04/28/20 0447 04/29/20 0122  WBC 8.9  12.3* 12.1* 12.8* 12.0* 10.5  NEUTROABS 8.0* 11.1* 10.6* 10.8* 9.6*  --   HGB 13.7 14.0 13.5 13.4 14.3 12.9*  HCT 40.9 42.8 41.6 41.5 42.9 37.8*  MCV 84.3 84.3 84.6 86.1 84.6 83.1  PLT 173 187 214 241 293 249   Basic Metabolic Panel: Recent Labs  Lab 04/25/20 0451 04/26/20 0403 04/27/20 0452 04/28/20 0447 04/29/20 0122  NA 133* 134* 135 134* 137  K 4.0 4.4 4.2 4.0 4.5  CL 97* 98 99 98 103  CO2 23 24 24 25 24   GLUCOSE 186* 200* 146* 117* 122*  BUN 17 19 20 17 15   CREATININE 0.97 0.98 0.94 1.14 0.96  CALCIUM 8.1* 8.2* 8.1* 7.9* 7.8*   GFR: Estimated Creatinine Clearance: 94 mL/min (by C-G formula based on SCr of 0.96 mg/dL). Liver Function Tests: Recent Labs  Lab 04/25/20 0451 04/26/20 0403 04/27/20 0452 04/28/20 0447 04/29/20 0122  AST 52* 38 30 30 27   ALT 58* 54* 45* 42 34  ALKPHOS 62 61 64 70 59  BILITOT 0.8 1.0 1.1 1.1 0.8  PROT 6.4* 6.3* 5.8* 6.1* 5.8*  ALBUMIN 2.7* 2.6* 2.5* 2.5* 2.3*   No results for input(s): LIPASE, AMYLASE in the last 168 hours. No results for input(s): AMMONIA  in the last 168 hours. Coagulation Profile: No results for input(s): INR, PROTIME in the last 168 hours. Cardiac Enzymes: No results for input(s): CKTOTAL, CKMB, CKMBINDEX, TROPONINI in the last 168 hours. BNP (last 3 results) No results for input(s): PROBNP in the last 8760 hours. HbA1C: No results for input(s): HGBA1C in the last 72 hours. CBG: No results for input(s): GLUCAP in the last 168 hours. Lipid Profile: No results for input(s): CHOL, HDL, LDLCALC, TRIG, CHOLHDL, LDLDIRECT in the last 72 hours. Thyroid Function Tests: No results for input(s): TSH, T4TOTAL, FREET4, T3FREE, THYROIDAB in the last 72 hours. Anemia Panel: No results for input(s): VITAMINB12, FOLATE, FERRITIN, TIBC, IRON, RETICCTPCT in the last 72 hours. Sepsis Labs: Recent Labs  Lab 04/23/20 1414 04/23/20 1605 04/29/20 0122  PROCALCITON 0.10  --  <0.10  LATICACIDVEN  --  2.9*  --     Recent  Results (from the past 240 hour(s))  Blood Culture (routine x 2)     Status: None   Collection Time: 04/23/20  4:47 PM   Specimen: BLOOD  Result Value Ref Range Status   Specimen Description BLOOD LEFT ANTECUBITAL  Final   Special Requests   Final    BOTTLES DRAWN AEROBIC AND ANAEROBIC Blood Culture results may not be optimal due to an excessive volume of blood received in culture bottles   Culture   Final    NO GROWTH 5 DAYS Performed at [facility], [address], [place], [place] [address]    Report Status 04/28/2020 FINAL  Final  Blood Culture (routine x 2)     Status: None   Collection Time: 04/23/20  4:48 PM   Specimen: BLOOD LEFT FOREARM  Result Value Ref Range Status   Specimen Description BLOOD LEFT FOREARM  Final   Special Requests   Final    BOTTLES DRAWN AEROBIC AND ANAEROBIC Blood Culture adequate volume   Culture   Final    NO GROWTH 5 DAYS Performed at [facility], [address] [place], [place] [address]    Report Status 04/28/2020 FINAL  Final     Radiology Studies: DG Chest 1 View  Result Date: 04/29/2020 CLINICAL DATA:  50 year old male respiratory distress.  COVID-19. EXAM: CHEST  1 VIEW COMPARISON:  Portable chest 04/23/2020 and earlier. FINDINGS: AP view at 0339 hours. Progressed confluence of widespread bilateral interstitial and patchy/indistinct pulmonary opacity, more extensive in the left lung. Upper lungs relatively spared. Mildly lower lung volumes. Mediastinal contours remain normal. Visualized tracheal air column is within normal limits. No pneumothorax or pleural effusion. Stable visualized osseous structures. IMPRESSION: Progressed bilateral COVID-19 pneumonia since 04/23/2020. No new te cardiopulmonary abnormality. Electronically Signed   By: Odessa Fleming M.D.   On: 04/29/2020 04:25    Scheduled Meds: . amLODipine  5 mg Oral Daily  . atorvastatin  40 mg Oral QHS  . enoxaparin (LOVENOX) injection  40 mg Subcutaneous Q24H  . OLANZapine  zydis  2.5 mg Oral BID  . predniSONE  50 mg Oral Q breakfast   Continuous Infusions:   LOS: 6 days   Time spent:  Azucena Fallen, DO [facility]  If 7PM-7AM, please contact night-coverage [email or web address]  04/29/2020, 6:17 AM

## 2020-04-29 NOTE — Plan of Care (Signed)
  Problem: Education: Goal: Knowledge of risk factors and measures for prevention of condition will improve Outcome: Not Progressing   Problem: Coping: Goal: Psychosocial and spiritual needs will be supported Outcome: Not Progressing   Problem: Respiratory: Goal: Will maintain a patent airway Outcome: Not Progressing Goal: Complications related to the disease process, condition or treatment will be avoided or minimized Outcome: Not Progressing   

## 2020-04-30 ENCOUNTER — Inpatient Hospital Stay (HOSPITAL_COMMUNITY): Payer: 59

## 2020-04-30 DIAGNOSIS — R7989 Other specified abnormal findings of blood chemistry: Secondary | ICD-10-CM

## 2020-04-30 DIAGNOSIS — J1282 Pneumonia due to coronavirus disease 2019: Secondary | ICD-10-CM | POA: Diagnosis not present

## 2020-04-30 DIAGNOSIS — U071 COVID-19: Secondary | ICD-10-CM | POA: Diagnosis not present

## 2020-04-30 LAB — COMPREHENSIVE METABOLIC PANEL
ALT: 37 U/L (ref 0–44)
AST: 27 U/L (ref 15–41)
Albumin: 2.3 g/dL — ABNORMAL LOW (ref 3.5–5.0)
Alkaline Phosphatase: 61 U/L (ref 38–126)
Anion gap: 8 (ref 5–15)
BUN: 16 mg/dL (ref 6–20)
CO2: 27 mmol/L (ref 22–32)
Calcium: 7.9 mg/dL — ABNORMAL LOW (ref 8.9–10.3)
Chloride: 99 mmol/L (ref 98–111)
Creatinine, Ser: 1.03 mg/dL (ref 0.61–1.24)
GFR, Estimated: >60 mL/min (ref >60)
Glucose, Bld: 123 mg/dL — ABNORMAL HIGH (ref 70–99)
Potassium: 4.5 mmol/L (ref 3.5–5.1)
Sodium: 134 mmol/L — ABNORMAL LOW (ref 135–145)
Total Bilirubin: 0.8 mg/dL (ref 0.3–1.2)
Total Protein: 6.1 g/dL — ABNORMAL LOW (ref 6.5–8.1)

## 2020-04-30 LAB — CBC
HCT: 40.4 % (ref 39.0–52.0)
Hemoglobin: 13.1 g/dL (ref 13.0–17.0)
MCH: 27.5 pg (ref 26.0–34.0)
MCHC: 32.4 g/dL (ref 30.0–36.0)
MCV: 84.9 fL (ref 80.0–100.0)
Platelets: 219 10*3/uL (ref 150–400)
RBC: 4.76 MIL/uL (ref 4.22–5.81)
RDW: 12.2 % (ref 11.5–15.5)
WBC: 11.8 10*3/uL — ABNORMAL HIGH (ref 4.0–10.5)
nRBC: 0 % (ref 0.0–0.2)

## 2020-04-30 LAB — D-DIMER, QUANTITATIVE: D-Dimer, Quant: 11.28 ug/mL-FEU — ABNORMAL HIGH (ref 0.00–0.50)

## 2020-04-30 LAB — C-REACTIVE PROTEIN: CRP: 15.3 mg/dL — ABNORMAL HIGH (ref <1.0)

## 2020-04-30 MED ORDER — ALBUTEROL SULFATE HFA 108 (90 BASE) MCG/ACT IN AERS
2.0000 | INHALATION_SPRAY | Freq: Four times a day (QID) | RESPIRATORY_TRACT | Status: DC | PRN
Start: 2020-04-30 — End: 2020-05-04
  Administered 2020-04-30 – 2020-05-03 (×3): 2 via RESPIRATORY_TRACT
  Filled 2020-04-30: qty 6.7

## 2020-04-30 MED ORDER — SALINE SPRAY 0.65 % NA SOLN
1.0000 | NASAL | Status: DC | PRN
  Administered 2020-04-30: 1 via NASAL
  Filled 2020-04-30: qty 44

## 2020-04-30 MED ORDER — ENOXAPARIN SODIUM 80 MG/0.8ML ~~LOC~~ SOLN
80.0000 mg | Freq: Two times a day (BID) | SUBCUTANEOUS | Status: DC
  Administered 2020-04-30 – 2020-05-03 (×6): 80 mg via SUBCUTANEOUS
  Filled 2020-04-30 (×6): qty 0.8

## 2020-04-30 NOTE — CV Procedure (Signed)
BLE venous duplex completed.  Results can be found under chart review under CV PROC. 04/30/2020 1:40 PM Jayon Matton RVT, RDMS

## 2020-04-30 NOTE — Plan of Care (Signed)
  Problem: Education: Goal: Knowledge of risk factors and measures for prevention of condition will improve Outcome: Progressing   Problem: Coping: Goal: Psychosocial and spiritual needs will be supported Outcome: Progressing   Problem: Respiratory: Goal: Will maintain a patent airway Outcome: Progressing Goal: Complications related to the disease process, condition or treatment will be avoided or minimized Outcome: Progressing   

## 2020-04-30 NOTE — Progress Notes (Signed)
PROGRESS NOTE    Frank MatterJohn S Coots  UJW:[id number] DOB: [birth year] DOA: 04/23/2020 PCP: Pcp, No   Brief Narrative:  Frank Richmond is a 50 y.o. male with medical history significant of hypertension, hyperlipidemia, remote history of hepatitis B and recent COVID infection presented to ED with a complaint of cough, fatigue, shortness of breath, fevers and chills.  Patient started his symptoms on January 1.  He presented himself to [facility] on January 8 where he was tested positive for COVID-19 and was admitted.  He left that facility AMA on January 10 due to having paranoid thoughts that he was getting some sort of, medication mixed in the oxygen.  He then started having some diarrhea which started yesterday but improved somewhat today.  He also has some chest pain with a deep breathing.  Per patient, he had CAT scan of the chest done in [facility] and he was ruled out of PE.  Also endorses some chills and sweating as well as some subjective fever at home. Upon arrival to ED here, he was tachycardic, tachypneic and was saturating 86% on room air requiring 2 L of oxygen.  He was once again tested positive for COVID-19.  Lactic acid elevated at 2.9.  Elevated inflammatory markers but unremarkable procalcitonin.  Interestingly, his D-dimer is also within normal range.  Patient was started on steroids and remdesivir and hospital service were consulted to admit the patient for further management.  ED provider is working on getting records from [facility].   Assessment & Plan:  Acute hypoxic respiratory failure secondary to COVID-19 pneumonia Rule out acute PE:  - Continue steroid taper - Completed Remdesivir 04/28/2020  - Patient initially refusing baricitinib/actemra - now sick for >2 weeks (possible 3) - of limited value - will no longer pursue this treatment - Patient family requesting non FDA approved medications over the phone - we discussed that the patient would only be placed on  medications per our current protocol which do not include hydroxychloroquine/etc. - Inflammatory markers increasing - Dimer acutely elevated with clinically worsening respiratory status - start full dose lovenox for presumed DVT - CTA negative - BLE US pending - Oxygen waxing/waning over the past 48 hours SpO2: 93 % O2 Flow Rate (L/min): 15 L/min FiO2 (%): 75 % - Continued encouragement to prone, out of bed to chair as tolerated, to use incentive spirometry and flutter valve - Patient's overnight behavior, hallucinations, and vivid dreams appear to be resolving, continue - Zyprexa, Seroquel previously discontinued -appreciate psych insight and recommendations Recent Labs    04/28/20 0447 04/29/20 0122 04/30/20 0410  DDIMER 5.84* 10.09* 11.28*  CRP 7.3* 15.7* 15.3*   Noncompliance in the setting of questionable psychiatric illness below - Patient continues to have episodes of bargaining, noncompliance; which appears to be generally improving. - Began refusing Seroquel -transition to Zyprexa per psych - Patient continues to be without any apneic or hypoxic events overnight or while sleeping while not using CPAP only further justifying the patient likely does not have sleep apnea -although a formal sleep evaluation in the outpatient setting is certainly reasonable given patient's concerns - DC CPAP.  Anxiety/paranoia/behavioral disturbances not otherwise specified, POA Insomnia; Concern for concurrent COVID delirium/hospital delirium with hallucinations - Multiple episodes of erratic behavior/fixated on his respiratory status since admission - Patient left AMA from previous facility due to "additional medications being pushed through the oxygen" per report - Psychiatry following, patient continued to refuse Seroquel, have transition to Zyprexa with moderate improvement  in symptoms  Essential HTN - Continue metoprolol only, patient no longer taking benazepril  Hyperlipidemia:  - Resume  home statin.  DVT prophylaxis: Lovenox, treatment dose given elevated Dimer per protocol Code Status: Full Family Communication: Brother updated at length previously - we discussed patient's current medication regimen and disposition. He requested we look into hydroxychloroquine, melatonin, and other medications. I explained that I would be happy to look through any peer-reviewed medication trials if he would provide the article name and journal it could be found in. Patient to update family at this point.  Status is: Inpt  Dispo: The patient is from: Home              Anticipated d/c is to: Home              Anticipated d/c date is: 72+hours              Patient currently NOT medically stable for discharge  Consultants:   None  Procedures:   None  Antimicrobials:  Remdesivir   Subjective: No acute issues or events overnight, patient continues to have symptoms of dyspnea even at rest, worse with exertion.  His sats continue to be stable if anything minimally improving from previous.  Denies nausea, vomiting, diarrhea, constipation, headache, fevers, chills.  Objective: Vitals:   04/29/20 1003 04/29/20 2151 04/30/20 1115 04/30/20 1336  BP: 114/70 123/81  (!) 142/94  Pulse:  90  (!) 109  Resp:  19  19  Temp:  97.8 F (36.6 C)  98.6 F (37 C)  TempSrc:  Oral    SpO2:  94%  93%  Weight:   83.2 kg   Height:        Intake/Output Summary (Last 24 hours) at 04/30/2020 1502 Last data filed at 04/30/2020 1044 Gross per 24 hour  Intake 75 ml  Output 1650 ml  Net -1575 ml   Filed Weights   04/27/20 0500 04/28/20 0500 04/30/20 1115  Weight: 84.4 kg 83.4 kg 83.2 kg    Examination:  General: Sitting at bedside chair, No acute distress.  HEENT:  Normocephalic atraumatic.  Sclerae nonicteric, noninjected.  Extraocular movements intact bilaterally. Neck:  Without mass or deformity.  Trachea is midline. Lungs: Diminished diffusely without rhonchi, wheeze, or rales. Moderately  tachypneic. Heart:  Regular rate and rhythm.  Without murmurs, rubs, or gallops. Abdomen:  Soft, nontender, nondistended.  Without guarding or rebound. Extremities: Without cyanosis, clubbing, edema, or obvious deformity. Vascular:  Dorsalis pedis and posterior tibial pulses palpable bilaterally. Skin:  Warm and dry, no erythema, no ulcerations.  Data Reviewed: I have personally reviewed following labs and imaging studies  CBC: Recent Labs  Lab 04/24/20 0327 04/25/20 0451 04/26/20 0403 04/27/20 0452 04/28/20 0447 04/29/20 0122 04/30/20 0410  WBC 8.9 12.3* 12.1* 12.8* 12.0* 10.5 11.8*  NEUTROABS 8.0* 11.1* 10.6* 10.8* 9.6*  --   --   HGB 13.7 14.0 13.5 13.4 14.3 12.9* 13.1  HCT 40.9 42.8 41.6 41.5 42.9 37.8* 40.4  MCV 84.3 84.3 84.6 86.1 84.6 83.1 84.9  PLT 173 187 214 241 293 249 219   Basic Metabolic Panel: Recent Labs  Lab 04/26/20 0403 04/27/20 0452 04/28/20 0447 04/29/20 0122 04/30/20 0410  NA 134* 135 134* 137 134*  K 4.4 4.2 4.0 4.5 4.5  CL 98 99 98 103 99  CO2 24 24 25 24 27   GLUCOSE 200* 146* 117* 122* 123*  BUN 19 20 17 15 16   CREATININE 0.98 0.94 1.14 0.96 1.03  CALCIUM 8.2* 8.1* 7.9* 7.8* 7.9*   GFR: Estimated Creatinine Clearance: 87.5 mL/min (by C-G formula based on SCr of 1.03 mg/dL). Liver Function Tests: Recent Labs  Lab 04/26/20 0403 04/27/20 0452 04/28/20 0447 04/29/20 0122 04/30/20 0410  AST 38 30 30 27 27   ALT 54* 45* 42 34 37  ALKPHOS 61 64 70 59 61  BILITOT 1.0 1.1 1.1 0.8 0.8  PROT 6.3* 5.8* 6.1* 5.8* 6.1*  ALBUMIN 2.6* 2.5* 2.5* 2.3* 2.3*   No results for input(s): LIPASE, AMYLASE in the last 168 hours. No results for input(s): AMMONIA in the last 168 hours. Coagulation Profile: No results for input(s): INR, PROTIME in the last 168 hours. Cardiac Enzymes: No results for input(s): CKTOTAL, CKMB, CKMBINDEX, TROPONINI in the last 168 hours. BNP (last 3 results) No results for input(s): PROBNP in the last 8760 hours. HbA1C: No  results for input(s): HGBA1C in the last 72 hours. CBG: No results for input(s): GLUCAP in the last 168 hours. Lipid Profile: No results for input(s): CHOL, HDL, LDLCALC, TRIG, CHOLHDL, LDLDIRECT in the last 72 hours. Thyroid Function Tests: No results for input(s): TSH, T4TOTAL, FREET4, T3FREE, THYROIDAB in the last 72 hours. Anemia Panel: No results for input(s): VITAMINB12, FOLATE, FERRITIN, TIBC, IRON, RETICCTPCT in the last 72 hours. Sepsis Labs: Recent Labs  Lab 04/23/20 1605 04/29/20 0122  PROCALCITON  --  <0.10  LATICACIDVEN 2.9*  --     Recent Results (from the past 240 hour(s))  Blood Culture (routine x 2)     Status: None   Collection Time: 04/23/20  4:47 PM   Specimen: BLOOD  Result Value Ref Range Status   Specimen Description BLOOD LEFT ANTECUBITAL  Final   Special Requests   Final    BOTTLES DRAWN AEROBIC AND ANAEROBIC Blood Culture results may not be optimal due to an excessive volume of blood received in culture bottles   Culture   Final    NO GROWTH 5 DAYS Performed at [facility], [address] [place], [place]    Report Status 04/28/2020 FINAL  Final  Blood Culture (routine x 2)     Status: None   Collection Time: 04/23/20  4:48 PM   Specimen: BLOOD LEFT FOREARM  Result Value Ref Range Status   Specimen Description BLOOD LEFT FOREARM  Final   Special Requests   Final    BOTTLES DRAWN AEROBIC AND ANAEROBIC Blood Culture adequate volume   Culture   Final    NO GROWTH 5 DAYS Performed at [facility], [address] [place], [place]    Report Status 04/28/2020 FINAL  Final     Radiology Studies: DG Chest 1 View  Result Date: 04/29/2020 CLINICAL DATA:  50 year old male respiratory distress.  COVID-19. EXAM: CHEST  1 VIEW COMPARISON:  Portable chest 04/23/2020 and earlier. FINDINGS: AP view at 0339 hours. Progressed confluence of widespread bilateral interstitial and patchy/indistinct pulmonary opacity, more extensive  in the left lung. Upper lungs relatively spared. Mildly lower lung volumes. Mediastinal contours remain normal. Visualized tracheal air column is within normal limits. No pneumothorax or pleural effusion. Stable visualized osseous structures. IMPRESSION: Progressed bilateral COVID-19 pneumonia since 04/23/2020. No new te cardiopulmonary abnormality. Electronically Signed   By: 06/21/2020 M.D.   On: 04/29/2020 04:25   CT ANGIO CHEST PE W OR WO CONTRAST  Result Date: 04/29/2020 CLINICAL DATA:  Shortness of breath and worsening. EXAM: CT ANGIOGRAPHY CHEST WITH CONTRAST TECHNIQUE: Multidetector CT imaging  of the chest was performed using the standard protocol during bolus administration of intravenous contrast. Multiplanar CT image reconstructions and MIPs were obtained to evaluate the vascular anatomy. CONTRAST:  75mL OMNIPAQUE IOHEXOL 350 MG/ML SOLN COMPARISON:  Chest x-ray 04/29/2020. CT angiography chest 04/21/2020. FINDINGS: Cardiovascular: Satisfactory opacification of the pulmonary arteries to the segmental level. No evidence of pulmonary embolism. Normal heart size. No significant pericardial effusion. The thoracic aorta is normal in caliber. No atherosclerotic plaque of the thoracic aorta. No coronary artery calcifications. Mediastinum/Nodes: Borderline enlarged mediastinal lymph nodes. No enlarged hilar or axillary lymph nodes. Thyroid gland, trachea, and esophagus demonstrate no significant findings. Small hiatal hernia. Lungs/Pleura: Multifocal consolidative and ground-glass airspace opacities are more prominent in the lower lobes and involving the majority of the lungs. Unable to evaluate for pulmonary nodule or mass. No pleural effusion. No pneumothorax. Upper Abdomen: No acute abnormality. Musculoskeletal: No chest wall abnormality. No acute or significant osseous findings. Review of the MIP images confirms the above findings. IMPRESSION: 1. No pulmonary embolus. 2. Diffuse multifocal pneumonia  consistent with COVID-19 infection. Electronically Signed   By: Tish FredericksonMorgane  Naveau M.D.   On: 04/29/2020 18:12   VAS US LOWER EXTREMITY VENOUS (DVT)  Result Date: 04/30/2020  Lower Venous DVT Study Other Indications: Covid+ with elevated D-dimer. Comparison Study: No prior studies Performing Technologist: Ernestene MentionJody Hill  Examination Guidelines: A complete evaluation includes B-mode imaging, spectral Doppler, color Doppler, and power Doppler as needed of all accessible portions of each vessel. Bilateral testing is considered an integral part of a complete examination. Limited examinations for reoccurring indications may be performed as noted. The reflux portion of the exam is performed with the patient in reverse Trendelenburg.  +---------+---------------+---------+-----------+----------+--------------+ RIGHT    CompressibilityPhasicitySpontaneityPropertiesThrombus Aging +---------+---------------+---------+-----------+----------+--------------+ CFV      Full           Yes      Yes                                 +---------+---------------+---------+-----------+----------+--------------+ SFJ      Full                                                        +---------+---------------+---------+-----------+----------+--------------+ FV Prox  Full           Yes      Yes                                 +---------+---------------+---------+-----------+----------+--------------+ FV Mid   Full           Yes      Yes                                 +---------+---------------+---------+-----------+----------+--------------+ FV DistalFull           Yes      Yes                                 +---------+---------------+---------+-----------+----------+--------------+ PFV      Full                                                        +---------+---------------+---------+-----------+----------+--------------+  POP      Full           Yes      Yes                                  +---------+---------------+---------+-----------+----------+--------------+ PTV      Full                                                        +---------+---------------+---------+-----------+----------+--------------+ PERO     Full                                                        +---------+---------------+---------+-----------+----------+--------------+   +---------+---------------+---------+-----------+----------+--------------+ LEFT     CompressibilityPhasicitySpontaneityPropertiesThrombus Aging +---------+---------------+---------+-----------+----------+--------------+ CFV      Full           Yes      Yes                                 +---------+---------------+---------+-----------+----------+--------------+ SFJ      Full                                                        +---------+---------------+---------+-----------+----------+--------------+ FV Prox  Full           Yes      Yes                                 +---------+---------------+---------+-----------+----------+--------------+ FV Mid   Full           Yes      Yes                                 +---------+---------------+---------+-----------+----------+--------------+ FV DistalFull           Yes      Yes                                 +---------+---------------+---------+-----------+----------+--------------+ PFV      Full                                                        +---------+---------------+---------+-----------+----------+--------------+ POP      Full           Yes      Yes                                 +---------+---------------+---------+-----------+----------+--------------+ PTV  Full                                                        +---------+---------------+---------+-----------+----------+--------------+ PERO     Full                                                         +---------+---------------+---------+-----------+----------+--------------+     Summary: BILATERAL: - No evidence of deep vein thrombosis seen in the lower extremities, bilaterally. - RIGHT: - No cystic structure found in the popliteal fossa.  LEFT: - No cystic structure found in the popliteal fossa.  *See table(s) above for measurements and observations. Electronically signed by Waverly Ferrari MD on 04/30/2020 at 1:41:02 PM.    Final     Scheduled Meds: . amLODipine  5 mg Oral Daily  . atorvastatin  40 mg Oral QHS  . enoxaparin (LOVENOX) injection  80 mg Subcutaneous Q12H  . OLANZapine zydis  2.5 mg Oral BID  . predniSONE  50 mg Oral Q breakfast   Continuous Infusions:   LOS: 7 days   Time spent:  Azucena Fallen, DO [facility]  If 7PM-7AM, please contact night-coverage [email or web address]  04/30/2020, 3:02 PM

## 2020-05-01 DIAGNOSIS — J1282 Pneumonia due to coronavirus disease 2019: Secondary | ICD-10-CM | POA: Diagnosis not present

## 2020-05-01 DIAGNOSIS — U071 COVID-19: Secondary | ICD-10-CM | POA: Diagnosis not present

## 2020-05-01 LAB — COMPREHENSIVE METABOLIC PANEL
ALT: 42 U/L (ref 0–44)
AST: 30 U/L (ref 15–41)
Albumin: 2.2 g/dL — ABNORMAL LOW (ref 3.5–5.0)
Alkaline Phosphatase: 64 U/L (ref 38–126)
Anion gap: 13 (ref 5–15)
BUN: 15 mg/dL (ref 6–20)
CO2: 25 mmol/L (ref 22–32)
Calcium: 7.7 mg/dL — ABNORMAL LOW (ref 8.9–10.3)
Chloride: 98 mmol/L (ref 98–111)
Creatinine, Ser: 1.07 mg/dL (ref 0.61–1.24)
GFR, Estimated: >60 mL/min (ref >60)
Glucose, Bld: 61 mg/dL — ABNORMAL LOW (ref 70–99)
Potassium: 3.8 mmol/L (ref 3.5–5.1)
Sodium: 136 mmol/L (ref 135–145)
Total Bilirubin: 0.8 mg/dL (ref 0.3–1.2)
Total Protein: 5.9 g/dL — ABNORMAL LOW (ref 6.5–8.1)

## 2020-05-01 LAB — CBC
HCT: 40.4 % (ref 39.0–52.0)
Hemoglobin: 12.8 g/dL — ABNORMAL LOW (ref 13.0–17.0)
MCH: 27.5 pg (ref 26.0–34.0)
MCHC: 31.7 g/dL (ref 30.0–36.0)
MCV: 86.9 fL (ref 80.0–100.0)
Platelets: 273 10*3/uL (ref 150–400)
RBC: 4.65 MIL/uL (ref 4.22–5.81)
RDW: 12.8 % (ref 11.5–15.5)
WBC: 12.3 10*3/uL — ABNORMAL HIGH (ref 4.0–10.5)
nRBC: 0 % (ref 0.0–0.2)

## 2020-05-01 LAB — D-DIMER, QUANTITATIVE: D-Dimer, Quant: 3.84 ug/mL-FEU — ABNORMAL HIGH (ref 0.00–0.50)

## 2020-05-01 LAB — C-REACTIVE PROTEIN: CRP: 8.9 mg/dL — ABNORMAL HIGH (ref <1.0)

## 2020-05-01 MED ORDER — PHENOL 1.4 % MT LIQD
1.0000 | OROMUCOSAL | Status: DC | PRN
  Administered 2020-05-01: 1 via OROMUCOSAL
  Filled 2020-05-01: qty 177

## 2020-05-01 MED ORDER — MENTHOL 3 MG MT LOZG
1.0000 | LOZENGE | OROMUCOSAL | Status: DC | PRN
  Administered 2020-05-01: 3 mg via ORAL
  Filled 2020-05-01 (×2): qty 9

## 2020-05-01 NOTE — Plan of Care (Signed)
  Problem: Education: Goal: Knowledge of risk factors and measures for prevention of condition will improve Outcome: Progressing   Problem: Coping: Goal: Psychosocial and spiritual needs will be supported Outcome: Progressing   Problem: Respiratory: Goal: Will maintain a patent airway Outcome: Progressing Goal: Complications related to the disease process, condition or treatment will be avoided or minimized Outcome: Progressing   

## 2020-05-01 NOTE — Plan of Care (Signed)
  Problem: Education: Goal: Knowledge of risk factors and measures for prevention of condition will improve Outcome: Progressing   

## 2020-05-01 NOTE — Progress Notes (Signed)
PROGRESS NOTE    Frank Richmond  OEH:[id number] DOB: 1970/11/05 DOA: 04/23/2020 PCP: Pcp, No   Brief Narrative:  Frank Richmond is a 50 y.o. male with medical history significant of hypertension, hyperlipidemia, remote history of hepatitis B and recent COVID infection presented to ED with a complaint of cough, fatigue, shortness of breath, fevers and chills.  Patient started his symptoms on January 1.  He presented himself to [facility] on January 8 where he was tested positive for COVID-19 and was admitted.  He left that facility AMA on January 10 due to having paranoid thoughts that he was getting some sort of, medication mixed in the oxygen.  He then started having some diarrhea which started yesterday but improved somewhat today.  He also has some chest pain with a deep breathing.  Per patient, he had CAT scan of the chest done in [facility] and he was ruled out of PE.  Also endorses some chills and sweating as well as some subjective fever at home. Upon arrival to ED here, he was tachycardic, tachypneic and was saturating 86% on room air requiring 2 L of oxygen.  He was once again tested positive for COVID-19.  Lactic acid elevated at 2.9.  Elevated inflammatory markers but unremarkable procalcitonin.  Interestingly, his D-dimer is also within normal range.  Patient was started on steroids and remdesivir and hospital service were consulted to admit the patient for further management.  ED provider is working on getting records from [facility].   Assessment & Plan:  Acute hypoxic respiratory failure secondary to COVID-19 pneumonia Rule out acute PE:  - Continue steroid taper - Completed Remdesivir 04/28/2020  - Patient initially refusing baricitinib/actemra - now sick for >2 weeks (possible 3) - of limited value - will no longer pursue this treatment - Patient family previously requesting non FDA approved medications over the phone - we discussed that the patient would only  be placed on medications per our current protocol which do not include hydroxychloroquine/etc. - Inflammatory markers increasing - Dimer acutely elevated with clinically worsening respiratory status - CTA negative/BLE [place] negative - Continue lovenox at higher dose while elevated dimer per protococl - will de-escalate as labs resolve - Oxygen waxing/waning over the past 48 hours - down to 10L [place] today at 88-90% - Continued encouragement to prone, out of bed to chair as tolerated, to use incentive spirometry and flutter valve Recent Labs    04/29/20 0122 04/30/20 0410 05/01/20 1345  DDIMER 10.09* 11.28* 3.84*  CRP 15.7* 15.3* 8.9*   Noncompliance in the setting of questionable psychiatric illness - Patient continues to have episodes of bargaining, noncompliance; which appears to be generally improving. - Began refusing Seroquel -transition to Zyprexa per psych - Patient continues to be without any apneic or hypoxic events overnight or while sleeping while not using CPAP only further justifying the patient likely does not have sleep apnea -although a formal sleep evaluation in the outpatient setting is certainly reasonable given patient's concerns - DC CPAP.  Anxiety/paranoia/behavioral disturbances not otherwise specified, POA Insomnia; Concern for concurrent COVID delirium/hospital delirium with hallucinations - Multiple episodes of erratic behavior/fixated on his respiratory status since admission - Patient left AMA from previous facility due to "additional medications being pushed through the oxygen" per report - Psychiatry following, patient continued to refuse Seroquel, have transition to Zyprexa with moderate improvement in symptoms  Essential HTN - Continue metoprolol only, patient no longer taking benazepril  Hyperlipidemia:  - Resume home statin.  DVT prophylaxis: Lovenox, treatment dose given elevated Dimer per protocol Code Status: Full Family Communication: Brother updated  at length previously - we discussed patient's current medication regimen and disposition. He requested we look into hydroxychloroquine, melatonin, and other medications. I explained that I would be happy to look through any peer-reviewed medication trials if he would provide the article name and journal it could be found in. Patient to update family at this point moving forward.  Status is: Inpt  Dispo: The patient is from: Home              Anticipated d/c is to: Home              Anticipated d/c date is: 72+hours              Patient currently NOT medically stable for discharge  Consultants:   None  Procedures:   None  Antimicrobials:  Remdesivir   Subjective: No acute issues or events overnight, patient continues to have symptoms of dyspnea even at rest, worse with exertion.  His sats continue minimally improving from previous currently on 10L [place].  Denies nausea, vomiting, diarrhea, constipation, headache, fevers, chills.  Objective: Vitals:   04/30/20 1336 04/30/20 2046 05/01/20 0500 05/01/20 0757  BP: (!) 142/94 134/78  119/85  Pulse: (!) 109 95  97  Resp: 19 20  20   Temp: 98.6 F (37 C) 98.1 F (36.7 C)  98.7 F (37.1 C)  TempSrc:    Oral  SpO2: 93% 96%  94%  Weight:   82.9 kg   Height:        Intake/Output Summary (Last 24 hours) at 05/01/2020 1654 Last data filed at 05/01/2020 1502 Gross per 24 hour  Intake --  Output 2925 ml  Net -2925 ml   Filed Weights   04/28/20 0500 04/30/20 1115 05/01/20 0500  Weight: 83.4 kg 83.2 kg 82.9 kg    Examination:  General: Sitting at bedside chair, somewhat anxious but no acute distress. HEENT:  Normocephalic atraumatic.  Sclerae nonicteric, noninjected.  Extraocular movements intact bilaterally. Neck:  Without mass or deformity.  Trachea is midline. Lungs: Diminished diffusely without rhonchi, wheeze, or rales. Moderately tachypneic. Heart:  Regular rate and rhythm.  Without murmurs, rubs, or gallops. Abdomen:  Soft,  nontender, nondistended.  Without guarding or rebound. Extremities: Without cyanosis, clubbing, edema, or obvious deformity. Vascular:  Dorsalis pedis and posterior tibial pulses palpable bilaterally. Skin:  Warm and dry, no erythema, no ulcerations.  Data Reviewed: I have personally reviewed following labs and imaging studies  CBC: Recent Labs  Lab 04/25/20 0451 04/26/20 0403 04/27/20 0452 04/28/20 0447 04/29/20 0122 04/30/20 0410 05/01/20 1345  WBC 12.3* 12.1* 12.8* 12.0* 10.5 11.8* 12.3*  NEUTROABS 11.1* 10.6* 10.8* 9.6*  --   --   --   HGB 14.0 13.5 13.4 14.3 12.9* 13.1 12.8*  HCT 42.8 41.6 41.5 42.9 37.8* 40.4 40.4  MCV 84.3 84.6 86.1 84.6 83.1 84.9 86.9  PLT 187 214 241 293 249 219 273   Basic Metabolic Panel: Recent Labs  Lab 04/27/20 0452 04/28/20 0447 04/29/20 0122 04/30/20 0410 05/01/20 1345  NA 135 134* 137 134* 136  K 4.2 4.0 4.5 4.5 3.8  CL 99 98 103 99 98  CO2 24 25 24 27 25   GLUCOSE 146* 117* 122* 123* 61*  BUN 20 17 15 16 15   CREATININE 0.94 1.14 0.96 1.03 1.07  CALCIUM 8.1* 7.9* 7.8* 7.9* 7.7*   GFR: Estimated Creatinine Clearance: 84.1 mL/min (  by C-G formula based on SCr of 1.07 mg/dL). Liver Function Tests: Recent Labs  Lab 04/27/20 0452 04/28/20 0447 04/29/20 0122 04/30/20 0410 05/01/20 1345  AST 30 30 27 27 30   ALT 45* 42 34 37 42  ALKPHOS 64 70 59 61 64  BILITOT 1.1 1.1 0.8 0.8 0.8  PROT 5.8* 6.1* 5.8* 6.1* 5.9*  ALBUMIN 2.5* 2.5* 2.3* 2.3* 2.2*   No results for input(s): LIPASE, AMYLASE in the last 168 hours. No results for input(s): AMMONIA in the last 168 hours. Coagulation Profile: No results for input(s): INR, PROTIME in the last 168 hours. Cardiac Enzymes: No results for input(s): CKTOTAL, CKMB, CKMBINDEX, TROPONINI in the last 168 hours. BNP (last 3 results) No results for input(s): PROBNP in the last 8760 hours. HbA1C: No results for input(s): HGBA1C in the last 72 hours. CBG: No results for input(s): GLUCAP in the  last 168 hours. Lipid Profile: No results for input(s): CHOL, HDL, LDLCALC, TRIG, CHOLHDL, LDLDIRECT in the last 72 hours. Thyroid Function Tests: No results for input(s): TSH, T4TOTAL, FREET4, T3FREE, THYROIDAB in the last 72 hours. Anemia Panel: No results for input(s): VITAMINB12, FOLATE, FERRITIN, TIBC, IRON, RETICCTPCT in the last 72 hours. Sepsis Labs: Recent Labs  Lab 04/29/20 0122  PROCALCITON <0.10    Recent Results (from the past 240 hour(s))  Blood Culture (routine x 2)     Status: None   Collection Time: 04/23/20  4:47 PM   Specimen: BLOOD  Result Value Ref Range Status   Specimen Description BLOOD LEFT ANTECUBITAL  Final   Special Requests   Final    BOTTLES DRAWN AEROBIC AND ANAEROBIC Blood Culture results may not be optimal due to an excessive volume of blood received in culture bottles   Culture   Final    NO GROWTH 5 DAYS Performed at [facility], [address], [place], [place]    Report Status 04/28/2020 FINAL  Final  Blood Culture (routine x 2)     Status: None   Collection Time: 04/23/20  4:48 PM   Specimen: BLOOD LEFT FOREARM  Result Value Ref Range Status   Specimen Description BLOOD LEFT FOREARM  Final   Special Requests   Final    BOTTLES DRAWN AEROBIC AND ANAEROBIC Blood Culture adequate volume   Culture   Final    NO GROWTH 5 DAYS Performed at [facility], [address], [place], [place]    Report Status 04/28/2020 FINAL  Final     Radiology Studies: CT ANGIO CHEST PE W OR WO CONTRAST  Result Date: 04/29/2020 CLINICAL DATA:  Shortness of breath and worsening. EXAM: CT ANGIOGRAPHY CHEST WITH CONTRAST TECHNIQUE: Multidetector CT imaging of the chest was performed using the standard protocol during bolus administration of intravenous contrast. Multiplanar CT image reconstructions and MIPs were obtained to evaluate the vascular anatomy. CONTRAST:  3mL OMNIPAQUE IOHEXOL 350 MG/ML SOLN COMPARISON:  Chest x-ray  04/29/2020. CT angiography chest 04/21/2020. FINDINGS: Cardiovascular: Satisfactory opacification of the pulmonary arteries to the segmental level. No evidence of pulmonary embolism. Normal heart size. No significant pericardial effusion. The thoracic aorta is normal in caliber. No atherosclerotic plaque of the thoracic aorta. No coronary artery calcifications. Mediastinum/Nodes: Borderline enlarged mediastinal lymph nodes. No enlarged hilar or axillary lymph nodes. Thyroid gland, trachea, and esophagus demonstrate no significant findings. Small hiatal hernia. Lungs/Pleura: Multifocal consolidative and ground-glass airspace opacities are more prominent in the lower lobes and involving the majority of the lungs.  Unable to evaluate for pulmonary nodule or mass. No pleural effusion. No pneumothorax. Upper Abdomen: No acute abnormality. Musculoskeletal: No chest wall abnormality. No acute or significant osseous findings. Review of the MIP images confirms the above findings. IMPRESSION: 1. No pulmonary embolus. 2. Diffuse multifocal pneumonia consistent with COVID-19 infection. Electronically Signed   By: Tish FredericksonMorgane  Naveau M.D.   On: 04/29/2020 18:12   VAS US LOWER EXTREMITY VENOUS (DVT)  Result Date: 04/30/2020  Lower Venous DVT Study Other Indications: Covid+ with elevated D-dimer. Comparison Study: No prior studies Performing Technologist: Ernestene MentionJody Hill  Examination Guidelines: A complete evaluation includes B-mode imaging, spectral Doppler, color Doppler, and power Doppler as needed of all accessible portions of each vessel. Bilateral testing is considered an integral part of a complete examination. Limited examinations for reoccurring indications may be performed as noted. The reflux portion of the exam is performed with the patient in reverse Trendelenburg.  +---------+---------------+---------+-----------+----------+--------------+ RIGHT    CompressibilityPhasicitySpontaneityPropertiesThrombus Aging  +---------+---------------+---------+-----------+----------+--------------+ CFV      Full           Yes      Yes                                 +---------+---------------+---------+-----------+----------+--------------+ SFJ      Full                                                        +---------+---------------+---------+-----------+----------+--------------+ FV Prox  Full           Yes      Yes                                 +---------+---------------+---------+-----------+----------+--------------+ FV Mid   Full           Yes      Yes                                 +---------+---------------+---------+-----------+----------+--------------+ FV DistalFull           Yes      Yes                                 +---------+---------------+---------+-----------+----------+--------------+ PFV      Full                                                        +---------+---------------+---------+-----------+----------+--------------+ POP      Full           Yes      Yes                                 +---------+---------------+---------+-----------+----------+--------------+ PTV      Full                                                        +---------+---------------+---------+-----------+----------+--------------+  PERO     Full                                                        +---------+---------------+---------+-----------+----------+--------------+   +---------+---------------+---------+-----------+----------+--------------+ LEFT     CompressibilityPhasicitySpontaneityPropertiesThrombus Aging +---------+---------------+---------+-----------+----------+--------------+ CFV      Full           Yes      Yes                                 +---------+---------------+---------+-----------+----------+--------------+ SFJ      Full                                                         +---------+---------------+---------+-----------+----------+--------------+ FV Prox  Full           Yes      Yes                                 +---------+---------------+---------+-----------+----------+--------------+ FV Mid   Full           Yes      Yes                                 +---------+---------------+---------+-----------+----------+--------------+ FV DistalFull           Yes      Yes                                 +---------+---------------+---------+-----------+----------+--------------+ PFV      Full                                                        +---------+---------------+---------+-----------+----------+--------------+ POP      Full           Yes      Yes                                 +---------+---------------+---------+-----------+----------+--------------+ PTV      Full                                                        +---------+---------------+---------+-----------+----------+--------------+ PERO     Full                                                        +---------+---------------+---------+-----------+----------+--------------+  Summary: BILATERAL: - No evidence of deep vein thrombosis seen in the lower extremities, bilaterally. - RIGHT: - No cystic structure found in the popliteal fossa.  LEFT: - No cystic structure found in the popliteal fossa.  *See table(s) above for measurements and observations. Electronically signed by Waverly Ferrarihristopher Dickson MD on 04/30/2020 at 1:41:02 PM.    Final     Scheduled Meds: . amLODipine  5 mg Oral Daily  . atorvastatin  40 mg Oral QHS  . enoxaparin (LOVENOX) injection  80 mg Subcutaneous Q12H  . OLANZapine zydis  2.5 mg Oral BID  . predniSONE  50 mg Oral Q breakfast   Continuous Infusions:   LOS: 8 days   Time spent: 40min  Azucena FallenWilliam C Marko Skalski, DO [facility]  If 7PM-7AM, please contact night-coverage [email or web address]  05/01/2020, 4:54 PM

## 2020-05-02 DIAGNOSIS — J1282 Pneumonia due to coronavirus disease 2019: Secondary | ICD-10-CM | POA: Diagnosis not present

## 2020-05-02 DIAGNOSIS — U071 COVID-19: Secondary | ICD-10-CM | POA: Diagnosis not present

## 2020-05-02 LAB — CBC
HCT: 35.2 % — ABNORMAL LOW (ref 39.0–52.0)
Hemoglobin: 12.2 g/dL — ABNORMAL LOW (ref 13.0–17.0)
MCH: 28.7 pg (ref 26.0–34.0)
MCHC: 34.7 g/dL (ref 30.0–36.0)
MCV: 82.8 fL (ref 80.0–100.0)
Platelets: 264 10*3/uL (ref 150–400)
RBC: 4.25 MIL/uL (ref 4.22–5.81)
RDW: 12.7 % (ref 11.5–15.5)
WBC: 11.3 10*3/uL — ABNORMAL HIGH (ref 4.0–10.5)
nRBC: 0 % (ref 0.0–0.2)

## 2020-05-02 LAB — COMPREHENSIVE METABOLIC PANEL
ALT: 65 U/L — ABNORMAL HIGH (ref 0–44)
AST: 33 U/L (ref 15–41)
Albumin: 2 g/dL — ABNORMAL LOW (ref 3.5–5.0)
Alkaline Phosphatase: 59 U/L (ref 38–126)
Anion gap: 9 (ref 5–15)
BUN: 15 mg/dL (ref 6–20)
CO2: 24 mmol/L (ref 22–32)
Calcium: 7.7 mg/dL — ABNORMAL LOW (ref 8.9–10.3)
Chloride: 103 mmol/L (ref 98–111)
Creatinine, Ser: 1.02 mg/dL (ref 0.61–1.24)
GFR, Estimated: >60 mL/min (ref >60)
Glucose, Bld: 107 mg/dL — ABNORMAL HIGH (ref 70–99)
Potassium: 3.5 mmol/L (ref 3.5–5.1)
Sodium: 136 mmol/L (ref 135–145)
Total Bilirubin: 1 mg/dL (ref 0.3–1.2)
Total Protein: 5.6 g/dL — ABNORMAL LOW (ref 6.5–8.1)

## 2020-05-02 LAB — C-REACTIVE PROTEIN: CRP: 9.1 mg/dL — ABNORMAL HIGH (ref <1.0)

## 2020-05-02 LAB — D-DIMER, QUANTITATIVE: D-Dimer, Quant: 2.95 ug/mL-FEU — ABNORMAL HIGH (ref 0.00–0.50)

## 2020-05-02 MED ORDER — ENSURE ENLIVE PO LIQD
237.0000 mL | Freq: Three times a day (TID) | ORAL | Status: DC
  Administered 2020-05-02 – 2020-05-04 (×7): 237 mL via ORAL

## 2020-05-02 MED ORDER — ADULT MULTIVITAMIN W/MINERALS CH
1.0000 | ORAL_TABLET | Freq: Every day | ORAL | Status: DC
  Administered 2020-05-02 – 2020-05-04 (×3): 1 via ORAL
  Filled 2020-05-02 (×3): qty 1

## 2020-05-02 NOTE — Progress Notes (Signed)
PROGRESS NOTE    Frank Richmond  FGH:[id number] DOB: [birth year] DOA: 04/23/2020 PCP: Pcp, No   Brief Narrative:  Frank Richmond is a 50 y.o. male with medical history significant of hypertension, hyperlipidemia, remote history of hepatitis B and recent COVID infection presented to ED with a complaint of cough, fatigue, shortness of breath, fevers and chills.  Patient started his symptoms on January 1.  He presented himself to [facility] on January 8 where he was tested positive for COVID-19 and was admitted.  He left that facility AMA on January 10 due to having paranoid thoughts that he was getting some sort of, medication mixed in the oxygen.  He then started having some diarrhea which started yesterday but improved somewhat today.  He also has some chest pain with a deep breathing.  Per patient, he had CAT scan of the chest done in [facility] and he was ruled out of PE.  Also endorses some chills and sweating as well as some subjective fever at home. Upon arrival to ED here, he was tachycardic, tachypneic and was saturating 86% on room air requiring 2 L of oxygen.  He was once again tested positive for COVID-19.  Lactic acid elevated at 2.9.  Elevated inflammatory markers but unremarkable procalcitonin.  Interestingly, his D-dimer is also within normal range.  Patient was started on steroids and remdesivir and hospital service were consulted to admit the patient for further management.  ED provider is working on getting records from [facility].   Assessment & Plan:  Acute hypoxic respiratory failure secondary to COVID-19 pneumonia Rule out acute PE:  - Continue steroid taper - Completed Remdesivir 04/28/2020  - Patient initially refusing baricitinib/actemra - now sick for >2 weeks (possibly 3) - of limited value - will no longer pursue this treatment - Patient family previously requesting non FDA approved medications over the phone - we discussed that the patient would only  be placed on medications per our current protocol which do not include hydroxychloroquine/etc. - Inflammatory markers increasing - Dimer acutely elevated with clinically worsening respiratory status - CTA negative/BLE [place] negative - Continue lovenox at higher dose while elevated dimer per protococl - will de-escalate as labs resolve - Oxygen waxing/waning over the past 48 hours - down to 5L  today at 88% - Continued encouragement to prone, out of bed to chair as tolerated, to use incentive spirometry and flutter valve Recent Labs    04/30/20 0410 05/01/20 1345 05/02/20 0703  DDIMER 11.28* 3.84* 2.95*  CRP 15.3* 8.9* 9.1*   Noncompliance in the setting of questionable psychiatric illness - Patient continues to have episodes of bargaining, noncompliance; which appears to be generally improving. - Began refusing Seroquel -transition to Zyprexa per psych - Patient continues to be without any apneic or hypoxic events overnight or while sleeping while not using CPAP only further justifying the patient likely does not have sleep apnea -although a formal sleep evaluation in the outpatient setting is certainly reasonable given patient's concerns - DC CPAP.  Anxiety/paranoia/behavioral disturbances not otherwise specified, POA Insomnia; Concern for concurrent COVID delirium/hospital delirium with hallucinations - Multiple episodes of erratic behavior/fixated on his respiratory status since admission - Patient left AMA from previous facility due to "additional medications being pushed through the oxygen" per report - Psychiatry following, patient continued to refuse Seroquel, have transition to Zyprexa with moderate improvement in symptoms  Essential HTN - Continue metoprolol only, patient no longer taking benazepril  Hyperlipidemia:  - Resume home statin.  DVT prophylaxis: Lovenox, treatment dose given elevated Dimer per protocol Code Status: Full Family Communication: Brother updated at  length previously - we discussed patient's current medication regimen and disposition. He requested we look into hydroxychloroquine, melatonin, and other medications. I explained that I would be happy to look through any peer-reviewed medication trials if he would provide the article name and journal it could be found in. Patient to update family at this point moving forward.  Status is: Inpt  Dispo: The patient is from: Home              Anticipated d/c is to: Home              Anticipated d/c date is: 48-72+hours              Patient currently NOT medically stable for discharge  Consultants:   None  Procedures:   None  Antimicrobials:  Remdesivir   Subjective: No acute issues or events overnight, patient continues to have symptoms of dyspnea even at rest, worse with exertion - but generally improving currently on 5L [place].  Denies nausea, vomiting, diarrhea, constipation, headache, fevers, chills.  Objective: Vitals:   05/01/20 0500 05/01/20 0757 05/01/20 2031 05/02/20 1423  BP:  119/85 140/88 129/69  Pulse:  97 (!) 105 (!) 104  Resp:  20 20 16   Temp:  98.7 F (37.1 C) 98.3 F (36.8 C) 98.1 F (36.7 C)  TempSrc:  Oral    SpO2:  94% 92% (!) 88%  Weight: 82.9 kg     Height:       No intake or output data in the 24 hours ending 05/02/20 1525 Filed Weights   04/28/20 0500 04/30/20 1115 05/01/20 0500  Weight: 83.4 kg 83.2 kg 82.9 kg    Examination:  General: Sitting at bedside chair, somewhat anxious but no acute distress. HEENT:  Normocephalic atraumatic.  Sclerae nonicteric, noninjected.  Extraocular movements intact bilaterally. Neck:  Without mass or deformity.  Trachea is midline. Lungs: Diminished diffusely without rhonchi, wheeze, or rales. Moderately tachypneic. Heart:  Regular rate and rhythm.  Without murmurs, rubs, or gallops. Abdomen:  Soft, nontender, nondistended.  Without guarding or rebound. Extremities: Without cyanosis, clubbing, edema, or obvious  deformity. Vascular:  Dorsalis pedis and posterior tibial pulses palpable bilaterally. Skin:  Warm and dry, no erythema, no ulcerations.  Data Reviewed: I have personally reviewed following labs and imaging studies  CBC: Recent Labs  Lab 04/26/20 0403 04/27/20 0452 04/28/20 0447 04/29/20 0122 04/30/20 0410 05/01/20 1345 05/02/20 0703  WBC 12.1* 12.8* 12.0* 10.5 11.8* 12.3* 11.3*  NEUTROABS 10.6* 10.8* 9.6*  --   --   --   --   HGB 13.5 13.4 14.3 12.9* 13.1 12.8* 12.2*  HCT 41.6 41.5 42.9 37.8* 40.4 40.4 35.2*  MCV 84.6 86.1 84.6 83.1 84.9 86.9 82.8  PLT 214 241 293 249 219 273 264   Basic Metabolic Panel: Recent Labs  Lab 04/28/20 0447 04/29/20 0122 04/30/20 0410 05/01/20 1345 05/02/20 0703  NA 134* 137 134* 136 136  K 4.0 4.5 4.5 3.8 3.5  CL 98 103 99 98 103  CO2 25 24 27 25 24   GLUCOSE 117* 122* 123* 61* 107*  BUN 17 15 16 15 15   CREATININE 1.14 0.96 1.03 1.07 1.02  CALCIUM 7.9* 7.8* 7.9* 7.7* 7.7*   GFR: Estimated Creatinine Clearance: 88.2 mL/min (by C-G formula based on SCr of 1.02 mg/dL). Liver Function Tests: Recent Labs  Lab 04/28/20 0447 04/29/20 0122 04/30/20  0410 05/01/20 1345 05/02/20 0703  AST 30 27 27 30  33  ALT 42 34 37 42 65*  ALKPHOS 70 59 61 64 59  BILITOT 1.1 0.8 0.8 0.8 1.0  PROT 6.1* 5.8* 6.1* 5.9* 5.6*  ALBUMIN 2.5* 2.3* 2.3* 2.2* 2.0*   No results for input(s): LIPASE, AMYLASE in the last 168 hours. No results for input(s): AMMONIA in the last 168 hours. Coagulation Profile: No results for input(s): INR, PROTIME in the last 168 hours. Cardiac Enzymes: No results for input(s): CKTOTAL, CKMB, CKMBINDEX, TROPONINI in the last 168 hours. BNP (last 3 results) No results for input(s): PROBNP in the last 8760 hours. HbA1C: No results for input(s): HGBA1C in the last 72 hours. CBG: No results for input(s): GLUCAP in the last 168 hours. Lipid Profile: No results for input(s): CHOL, HDL, LDLCALC, TRIG, CHOLHDL, LDLDIRECT in the last  72 hours. Thyroid Function Tests: No results for input(s): TSH, T4TOTAL, FREET4, T3FREE, THYROIDAB in the last 72 hours. Anemia Panel: No results for input(s): VITAMINB12, FOLATE, FERRITIN, TIBC, IRON, RETICCTPCT in the last 72 hours. Sepsis Labs: Recent Labs  Lab 04/29/20 0122  PROCALCITON <0.10    Recent Results (from the past 240 hour(s))  Blood Culture (routine x 2)     Status: None   Collection Time: 04/23/20  4:47 PM   Specimen: BLOOD  Result Value Ref Range Status   Specimen Description BLOOD LEFT ANTECUBITAL  Final   Special Requests   Final    BOTTLES DRAWN AEROBIC AND ANAEROBIC Blood Culture results may not be optimal due to an excessive volume of blood received in culture bottles   Culture   Final    NO GROWTH 5 DAYS Performed at [facility], [address] [place], [place]    Report Status 04/28/2020 FINAL  Final  Blood Culture (routine x 2)     Status: None   Collection Time: 04/23/20  4:48 PM   Specimen: BLOOD LEFT FOREARM  Result Value Ref Range Status   Specimen Description BLOOD LEFT FOREARM  Final   Special Requests   Final    BOTTLES DRAWN AEROBIC AND ANAEROBIC Blood Culture adequate volume   Culture   Final    NO GROWTH 5 DAYS Performed at [facility], [address], [place], [place]    Report Status 04/28/2020 FINAL  Final     Radiology Studies: No results found.  Scheduled Meds: . amLODipine  5 mg Oral Daily  . atorvastatin  40 mg Oral QHS  . enoxaparin (LOVENOX) injection  80 mg Subcutaneous Q12H  . feeding supplement  237 mL Oral TID BM  . multivitamin with minerals  1 tablet Oral Daily  . OLANZapine zydis  2.5 mg Oral BID  . predniSONE  50 mg Oral Q breakfast   Continuous Infusions:   LOS: 9 days   Time spent: 04/30/2020  , DO [facility]  If 7PM-7AM, please contact night-coverage [email or web address]  05/02/2020, 3:25 PM

## 2020-05-02 NOTE — Progress Notes (Signed)
Patient ambulated in hallway. Desat to high 70's. Quickly recovered to 88% at rest. Otherwise no signs of distress during or after ambulation.

## 2020-05-02 NOTE — Plan of Care (Signed)
  Problem: Education: Goal: Knowledge of risk factors and measures for prevention of condition will improve Outcome: Progressing   Problem: Coping: Goal: Psychosocial and spiritual needs will be supported Outcome: Progressing   Problem: Respiratory: Goal: Will maintain a patent airway Outcome: Progressing Goal: Complications related to the disease process, condition or treatment will be avoided or minimized Outcome: Progressing   

## 2020-05-02 NOTE — Consult Note (Signed)
   [facility] Inpatient Consult   05/02/2020  Frank Richmond 1971-02-03 [id number]  [facility] [[facility]]  Accountable Care Organization [ACO] Patient: [facility]  Patient screened for length of stay hospitalization to check for potential [facility]  [[facility]] Care Management service needs.  Review of patient's medical record reveals patient is noted to have high flow oxygen needs with COVID-19 diagnosis. MD notes reviewed for progression and disposition needs.  Primary Care Provider is not listed as having a PCP.  Plan:  Continue to follow progress and disposition to assess for post hospital care management needs.    Please place a [facility] Care Management consult as appropriate and for questions contact:   Charlesetta Shanks, RN BSN CCM [facility]  [phone] business mobile phone Toll free office [phone]  Fax number: [phone] [email or web address]

## 2020-05-02 NOTE — Progress Notes (Signed)
Initial Nutrition Assessment  DOCUMENTATION CODES:   Not applicable  INTERVENTION:   Ensure Enlive po TID, each supplement provides 350 kcal and 20 grams of protein  Magic cup TID with meals, each supplement provides 290 kcal and 9 grams of protein  MVI with minerals daily   NUTRITION DIAGNOSIS:   Increased nutrient needs related to catabolic illness (COVID-19) as evidenced by estimated needs.    GOAL:   Patient will meet greater than or equal to 90% of their needs    MONITOR:   PO intake,Supplement acceptance,Labs,Weight trends,I & O's  REASON FOR ASSESSMENT:   Malnutrition Screening Tool    ASSESSMENT:   Pt admitted with acute hypoxic rspiratory failure 2/2 COVID-19 PNA. PMH includes HTN, HLD, acute hepatitis B and recent COVID infection (symptoms started 04/13/20).  Pt initially presented to [facility] on 1/8 where he tested positive for COVID and ws admitted. Pt left AMA on 1/10 due to paranoia that medication was being mixed into his oxygen.   Pt denies N/V/D/constipation and reports appetite is fine. Per RN, pt typically eating at least half of his meals.  Will order oral nutrition supplements to provide pt with additional kcals/protein.  No weight history available to review.  PO Intake: 50-60% x 3 recorded meals (53% average meal intake)  UOP: x24 hours  Labs reviewed. Medications: deltasone    NUTRITION - FOCUSED PHYSICAL EXAM: Unable to perform at this time. Will attempt at follow-up.  Diet Order:   Diet Order            Diet Heart Room service appropriate? Yes; Fluid consistency: Thin  Diet effective now                 EDUCATION NEEDS:   No education needs have been identified at this time  Skin:  Skin Assessment: Reviewed RN Assessment  Last BM:  1/16  Height:   Ht Readings from Last 1 Encounters:  04/23/20 5\' 7"  (1.702 m)    Weight:   Wt Readings from Last 1 Encounters:  05/01/20 82.9 kg   BMI:  Body mass  index is 28.62 kg/m.  Estimated Nutritional Needs:   Kcal:  2200-2400  Protein:  110-125 grams  Fluid:  >2.2L/d    05/03/20, MS, RD, LDN RD pager number and weekend/on-call pager number located in Amion.

## 2020-05-03 LAB — CBC
HCT: 38.7 % — ABNORMAL LOW (ref 39.0–52.0)
Hemoglobin: 12.6 g/dL — ABNORMAL LOW (ref 13.0–17.0)
MCH: 27.9 pg (ref 26.0–34.0)
MCHC: 32.6 g/dL (ref 30.0–36.0)
MCV: 85.8 fL (ref 80.0–100.0)
Platelets: 291 10*3/uL (ref 150–400)
RBC: 4.51 MIL/uL (ref 4.22–5.81)
RDW: 12.6 % (ref 11.5–15.5)
WBC: 13.2 10*3/uL — ABNORMAL HIGH (ref 4.0–10.5)
nRBC: 0 % (ref 0.0–0.2)

## 2020-05-03 LAB — COMPREHENSIVE METABOLIC PANEL
ALT: 109 U/L — ABNORMAL HIGH (ref 0–44)
AST: 44 U/L — ABNORMAL HIGH (ref 15–41)
Albumin: 2.1 g/dL — ABNORMAL LOW (ref 3.5–5.0)
Alkaline Phosphatase: 62 U/L (ref 38–126)
Anion gap: 9 (ref 5–15)
BUN: 14 mg/dL (ref 6–20)
CO2: 25 mmol/L (ref 22–32)
Calcium: 7.7 mg/dL — ABNORMAL LOW (ref 8.9–10.3)
Chloride: 103 mmol/L (ref 98–111)
Creatinine, Ser: 1.01 mg/dL (ref 0.61–1.24)
GFR, Estimated: >60 mL/min (ref >60)
Glucose, Bld: 103 mg/dL — ABNORMAL HIGH (ref 70–99)
Potassium: 3.6 mmol/L (ref 3.5–5.1)
Sodium: 137 mmol/L (ref 135–145)
Total Bilirubin: 0.6 mg/dL (ref 0.3–1.2)
Total Protein: 5.7 g/dL — ABNORMAL LOW (ref 6.5–8.1)

## 2020-05-03 LAB — GLUCOSE, CAPILLARY: Glucose-Capillary: 209 mg/dL — ABNORMAL HIGH (ref 70–99)

## 2020-05-03 LAB — D-DIMER, QUANTITATIVE: D-Dimer, Quant: 2.44 ug/mL-FEU — ABNORMAL HIGH (ref 0.00–0.50)

## 2020-05-03 LAB — C-REACTIVE PROTEIN: CRP: 5.4 mg/dL — ABNORMAL HIGH (ref <1.0)

## 2020-05-03 MED ORDER — PREDNISONE 20 MG PO TABS
20.0000 mg | ORAL_TABLET | Freq: Every day | ORAL | Status: DC
  Administered 2020-05-04: 20 mg via ORAL
  Filled 2020-05-03: qty 1

## 2020-05-03 MED ORDER — AYR SALINE NASAL NA GEL
1.0000 "application " | NASAL | Status: DC | PRN
  Administered 2020-05-03: 1 via NASAL
  Filled 2020-05-03: qty 14.1

## 2020-05-03 MED ORDER — ENOXAPARIN SODIUM 40 MG/0.4ML ~~LOC~~ SOLN
40.0000 mg | SUBCUTANEOUS | Status: DC
  Administered 2020-05-04: 40 mg via SUBCUTANEOUS
  Filled 2020-05-03: qty 0.4

## 2020-05-03 NOTE — Progress Notes (Signed)
Patient ambulated in hall on 2L, saturation dropped to 85% and recovered to 90% when patient was at rest. No signs of distress during ambulation.

## 2020-05-03 NOTE — Progress Notes (Signed)
PROGRESS NOTE    Frank Richmond  GMW:[id number] DOB: 12-May-1970 DOA: 04/23/2020 PCP: Pcp, No   Brief Narrative:  Frank Richmond is a 49 y.o. male with medical history significant of hypertension, hyperlipidemia, remote history of hepatitis B and recent COVID infection presented to ED with a complaint of cough, fatigue, shortness of breath, fevers and chills.  Patient started his symptoms on January 1.  He presented himself to [facility] on January 8 where he was tested positive for COVID-19 and was admitted.  He left that facility AMA on January 10 due to having paranoid thoughts that he was getting some sort of, medication mixed in the oxygen.  He then started having some diarrhea which started yesterday but improved somewhat today.  He also has some chest pain with a deep breathing.  Per patient, he had CAT scan of the chest done in [facility] and he was ruled out of PE.  Also endorses some chills and sweating as well as some subjective fever at home. Upon arrival to ED here, he was tachycardic, tachypneic and was saturating 86% on room air requiring 2 L of oxygen.  He was once again tested positive for COVID-19.  Lactic acid elevated at 2.9.  Elevated inflammatory markers but unremarkable procalcitonin.  Interestingly, his D-dimer is also within normal range.  Patient was started on steroids and remdesivir and hospital service were consulted to admit the patient for further management.  ED provider is working on getting records from [facility].   Assessment & Plan:  Acute hypoxic respiratory failure secondary to COVID-19 pneumonia Rule out acute PE:  - Continue steroid taper - now on 20mg  daily - Completed Remdesivir 04/28/2020  - Patient initially refusing baricitinib/actemra - now sick for >2 weeks (possibly 3) - of limited value at this point - will no longer pursue this treatment - Inflammatory markers down trending - Decrease lovenox back to prophylactic dose - Oxygen  down trending appropriately - on 15L [place] and NRB earlier this week - currently on: SpO2: 90 % O2 Flow Rate (L/min): 4 L/min FiO2 (%): 75 % - Continued encouragement to prone, out of bed to chair as tolerated, to use incentive spirometry and flutter valve Recent Labs    05/01/20 1345 05/02/20 0703 05/03/20 0447  DDIMER 3.84* 2.95* 2.44*  CRP 8.9* 9.1* 5.4*   Noncompliance in the setting of questionable psychiatric illness - Patient continues to have episodes of bargaining, noncompliance; which appears to be generally improving. - Refusing Seroquel - continue Zyprexa per psych - Patient continues to be without any apneic or hypoxic events overnight or while sleeping while not using CPAP only further justifying the patient likely does not have sleep apnea -although a formal sleep evaluation in the outpatient setting is certainly reasonable given patient's concerns - DC CPAP.  Anxiety/paranoia/behavioral disturbances not otherwise specified, POA Insomnia; Concern for concurrent COVID delirium/hospital delirium with hallucinations - Multiple episodes of erratic behavior/fixated on his respiratory status since admission - Patient left AMA from previous facility due to "additional medications being pushed through the oxygen" per report - Psychiatry following, patient continued to refuse Seroquel, have transition to Zyprexa with moderate improvement in symptoms  Essential HTN - Continue metoprolol only, patient no longer taking benazepril  Hyperlipidemia:  - Resume home statin.  DVT prophylaxis: Lovenox ppx Code Status: Full Family Communication: Discussed at length with wife today. Brother updated at length previously - we discussed patient's current medication regimen and disposition. He requested we look  into hydroxychloroquine, melatonin, and other medications. I explained that I would be happy to look through any peer-reviewed medication trials if he would provide the article name  and journal it could be found in..  Status is: Inpt  Dispo: The patient is from: Home              Anticipated d/c is to: Home              Anticipated d/c date is: 48-72+hours              Patient currently NOT medically stable for discharge  Consultants:   None  Procedures:   None  Antimicrobials:  Remdesivir   Subjective: No acute issues or events overnight, patient continues to have symptoms of dyspnea with exertion only - no longer symptomatic at rest.  Denies nausea, vomiting, diarrhea, constipation, headache, fevers, chills.  Objective: Vitals:   05/01/20 2031 05/02/20 1423 05/02/20 2100 05/03/20 0621  BP: 140/88 129/69 (!) 97/52 122/75  Pulse: (!) 105 (!) 104 84 79  Resp: 20 16 16 16   Temp: 98.3 F (36.8 C) 98.1 F (36.7 C) 98.9 F (37.2 C) 98.9 F (37.2 C)  TempSrc:   Oral Oral  SpO2: 92% (!) 88% 90% 90%  Weight:      Height:        Intake/Output Summary (Last 24 hours) at 05/03/2020 0736 Last data filed at 05/03/2020 0456 Gross per 24 hour  Intake --  Output 400 ml  Net -400 ml   Filed Weights   04/28/20 0500 04/30/20 1115 05/01/20 0500  Weight: 83.4 kg 83.2 kg 82.9 kg    Examination:  General: Sitting at bedside chair, somewhat anxious but no acute distress. HEENT:  Normocephalic atraumatic.  Sclerae nonicteric, noninjected.  Extraocular movements intact bilaterally. Neck:  Without mass or deformity.  Trachea is midline. Lungs: Diminished diffusely without rhonchi, wheeze, or rales. Moderately tachypneic. Heart:  Regular rate and rhythm.  Without murmurs, rubs, or gallops. Abdomen:  Soft, nontender, nondistended.  Without guarding or rebound. Extremities: Without cyanosis, clubbing, edema, or obvious deformity. Vascular:  Dorsalis pedis and posterior tibial pulses palpable bilaterally. Skin:  Warm and dry, no erythema, no ulcerations.  Data Reviewed: I have personally reviewed following labs and imaging studies  CBC: Recent Labs  Lab  04/27/20 0452 04/28/20 0447 04/29/20 0122 04/30/20 0410 05/01/20 1345 05/02/20 0703 05/03/20 0447  WBC 12.8* 12.0* 10.5 11.8* 12.3* 11.3* 13.2*  NEUTROABS 10.8* 9.6*  --   --   --   --   --   HGB 13.4 14.3 12.9* 13.1 12.8* 12.2* 12.6*  HCT 41.5 42.9 37.8* 40.4 40.4 35.2* 38.7*  MCV 86.1 84.6 83.1 84.9 86.9 82.8 85.8  PLT 241 293 249 219 273 264 291   Basic Metabolic Panel: Recent Labs  Lab 04/29/20 0122 04/30/20 0410 05/01/20 1345 05/02/20 0703 05/03/20 0447  NA 137 134* 136 136 137  K 4.5 4.5 3.8 3.5 3.6  CL 103 99 98 103 103  CO2 24 27 25 24 25   GLUCOSE 122* 123* 61* 107* 103*  BUN 15 16 15 15 14   CREATININE 0.96 1.03 1.07 1.02 1.01  CALCIUM 7.8* 7.9* 7.7* 7.7* 7.7*   GFR: Estimated Creatinine Clearance: 89.1 mL/min (by C-G formula based on SCr of 1.01 mg/dL). Liver Function Tests: Recent Labs  Lab 04/29/20 0122 04/30/20 0410 05/01/20 1345 05/02/20 0703 05/03/20 0447  AST 27 27 30  33 44*  ALT 34 37 42 65* 109*  ALKPHOS 59  61 64 59 62  BILITOT 0.8 0.8 0.8 1.0 0.6  PROT 5.8* 6.1* 5.9* 5.6* 5.7*  ALBUMIN 2.3* 2.3* 2.2* 2.0* 2.1*   No results for input(s): LIPASE, AMYLASE in the last 168 hours. No results for input(s): AMMONIA in the last 168 hours. Coagulation Profile: No results for input(s): INR, PROTIME in the last 168 hours. Cardiac Enzymes: No results for input(s): CKTOTAL, CKMB, CKMBINDEX, TROPONINI in the last 168 hours. BNP (last 3 results) No results for input(s): PROBNP in the last 8760 hours. HbA1C: No results for input(s): HGBA1C in the last 72 hours. CBG: Recent Labs  Lab 05/03/20 0013  GLUCAP 209*   Lipid Profile: No results for input(s): CHOL, HDL, LDLCALC, TRIG, CHOLHDL, LDLDIRECT in the last 72 hours. Thyroid Function Tests: No results for input(s): TSH, T4TOTAL, FREET4, T3FREE, THYROIDAB in the last 72 hours. Anemia Panel: No results for input(s): VITAMINB12, FOLATE, FERRITIN, TIBC, IRON, RETICCTPCT in the last 72 hours. Sepsis  Labs: Recent Labs  Lab 04/29/20 0122  PROCALCITON <0.10    Recent Results (from the past 240 hour(s))  Blood Culture (routine x 2)     Status: None   Collection Time: 04/23/20  4:47 PM   Specimen: BLOOD  Result Value Ref Range Status   Specimen Description BLOOD LEFT ANTECUBITAL  Final   Special Requests   Final    BOTTLES DRAWN AEROBIC AND ANAEROBIC Blood Culture results may not be optimal due to an excessive volume of blood received in culture bottles   Culture   Final    NO GROWTH 5 DAYS Performed at [facility], [address], [place], [place] [address]    Report Status 04/28/2020 FINAL  Final  Blood Culture (routine x 2)     Status: None   Collection Time: 04/23/20  4:48 PM   Specimen: BLOOD LEFT FOREARM  Result Value Ref Range Status   Specimen Description BLOOD LEFT FOREARM  Final   Special Requests   Final    BOTTLES DRAWN AEROBIC AND ANAEROBIC Blood Culture adequate volume   Culture   Final    NO GROWTH 5 DAYS Performed at [facility], [address] [place], [place] [address]    Report Status 04/28/2020 FINAL  Final     Radiology Studies: No results found.  Scheduled Meds: . amLODipine  5 mg Oral Daily  . atorvastatin  40 mg Oral QHS  . enoxaparin (LOVENOX) injection  80 mg Subcutaneous Q12H  . feeding supplement  237 mL Oral TID BM  . multivitamin with minerals  1 tablet Oral Daily  . OLANZapine zydis  2.5 mg Oral BID  . predniSONE  50 mg Oral Q breakfast   Continuous Infusions:   LOS: 10 days   Time spent:  Azucena Fallen, DO [facility]  If 7PM-7AM, please contact night-coverage [email or web address]  05/03/2020, 7:36 AM

## 2020-05-03 NOTE — Plan of Care (Signed)
  Problem: Education: Goal: Knowledge of risk factors and measures for prevention of condition will improve Outcome: Progressing   Problem: Coping: Goal: Psychosocial and spiritual needs will be supported Outcome: Progressing   Problem: Respiratory: Goal: Will maintain a patent airway Outcome: Progressing Goal: Complications related to the disease process, condition or treatment will be avoided or minimized Outcome: Progressing   

## 2020-05-03 NOTE — Progress Notes (Signed)
ANTICOAGULATION CONSULT NOTE   Pharmacy Consult for Enoxaparin Indication: VTE prophylaxis  No Known Allergies  Patient Measurements: Height: 5\' 7"  (170.2 cm) Weight: 82.9 kg (182 lb 12.2 oz) IBW/kg (Calculated) : 66.1  Vital Signs: Temp: 98.9 F (37.2 C) (01/21 1220) Temp Source: Oral (01/21 0621) BP: 110/72 (01/21 1220) Pulse Rate: 103 (01/21 1220)  Labs: Recent Labs    05/01/20 1345 05/02/20 0703 05/03/20 0447  HGB 12.8* 12.2* 12.6*  HCT 40.4 35.2* 38.7*  PLT 273 264 291  CREATININE 1.07 1.02 1.01    Estimated Creatinine Clearance: 89.1 mL/min (by C-G formula based on SCr of 1.01 mg/dL).   Medical History: Past Medical History:  Diagnosis Date  . Acute hepatitis B    s/p resolution (wife from 05/05/20 is a carrier)     Assessment: 50 yr old male admitted on 04/23/20 with acute hypoxic respiratory failure secondary secondary to COVID pneumonia and R/O acute PE. 1/17 CT: no PE; 1/18 Dopplers: no LE DVT. Pt has been receiving enoxaparin 80 mg SQ Q 12 hrs for R/O PE (last dose at 0505 AM today). Pharmacy is consulted to dose enoxaparin for VTE prophylaxis at this time.  H/H 12.6/38.7, platelets 291; d-dimer continues to trend down (2.44 today); TBW CrCl >100 ml/min (renal function stable), BMI 28.62  Goal of Therapy:  Prevention of VTE  Monitor platelets by anticoagulation protocol: Yes   Plan:  Start enoxaparin 40 mg SQ daily tomorrow AM (05/05/20) Monitor CBC Monitor for bleeding  05/07/20, PharmD, BCPS, [facility] Clinical Pharmacikst 05/03/2020,3:09 PM

## 2020-05-04 LAB — COMPREHENSIVE METABOLIC PANEL
ALT: 139 U/L — ABNORMAL HIGH (ref 0–44)
AST: 37 U/L (ref 15–41)
Albumin: 2.3 g/dL — ABNORMAL LOW (ref 3.5–5.0)
Alkaline Phosphatase: 60 U/L (ref 38–126)
Anion gap: 13 (ref 5–15)
BUN: 16 mg/dL (ref 6–20)
CO2: 21 mmol/L — ABNORMAL LOW (ref 22–32)
Calcium: 8.2 mg/dL — ABNORMAL LOW (ref 8.9–10.3)
Chloride: 101 mmol/L (ref 98–111)
Creatinine, Ser: 1 mg/dL (ref 0.61–1.24)
GFR, Estimated: >60 mL/min (ref >60)
Glucose, Bld: 112 mg/dL — ABNORMAL HIGH (ref 70–99)
Potassium: 3.9 mmol/L (ref 3.5–5.1)
Sodium: 135 mmol/L (ref 135–145)
Total Bilirubin: 0.8 mg/dL (ref 0.3–1.2)
Total Protein: 6.2 g/dL — ABNORMAL LOW (ref 6.5–8.1)

## 2020-05-04 LAB — CBC
HCT: 39.2 % (ref 39.0–52.0)
Hemoglobin: 13.3 g/dL (ref 13.0–17.0)
MCH: 28.5 pg (ref 26.0–34.0)
MCHC: 33.9 g/dL (ref 30.0–36.0)
MCV: 84.1 fL (ref 80.0–100.0)
Platelets: 346 10*3/uL (ref 150–400)
RBC: 4.66 MIL/uL (ref 4.22–5.81)
RDW: 13.2 % (ref 11.5–15.5)
WBC: 18.5 10*3/uL — ABNORMAL HIGH (ref 4.0–10.5)
nRBC: 0 % (ref 0.0–0.2)

## 2020-05-04 LAB — C-REACTIVE PROTEIN: CRP: 2.5 mg/dL — ABNORMAL HIGH (ref <1.0)

## 2020-05-04 LAB — D-DIMER, QUANTITATIVE: D-Dimer, Quant: 1.99 ug/mL-FEU — ABNORMAL HIGH (ref 0.00–0.50)

## 2020-05-04 MED ORDER — PREDNISONE 10 MG PO TABS: ORAL_TABLET | ORAL | 0 refills | Status: DC

## 2020-05-04 MED ORDER — PREDNISONE 10 MG PO TABS: ORAL_TABLET | ORAL | 0 refills | Status: AC

## 2020-05-04 NOTE — Discharge Summary (Signed)
Physician Discharge Summary  [name] RUE:[id number] DOB: [birth year] DOA: 04/23/2020  PCP: Oneita Hurt, No  Admit date: 04/23/2020 Discharge date: 05/04/2020  Admitted From: Home Disposition:  Home  Recommendations for Outpatient Follow-up:  1. Follow up with PCP in 1-2 weeks 2. Please obtain BMP/CBC in one week 3. Please follow up on the following pending results:  Home Health: None  Equipment/Devices:Oxygen  Discharge Condition: Stable  CODE STATUS:Full  Diet recommendation: As tolerated    Brief/Interim Summary: Frank Richmond a 50 y.o.malewith medical history significant ofhypertension, hyperlipidemia, remote history of hepatitis B and recent COVID infection presented to ED with a complaint of cough, fatigue, shortness of breath, fevers and chills. Patient started his symptoms on January 1. He presented himself to [facility] on January 8 where he was tested positive for COVID-19 and was admitted. He left that facility AMA on January 10 due to having paranoid thoughts that he was getting some sort of, medication mixed in the oxygen. He then started having some diarrhea which started yesterday but improved somewhat today. He also has some chest pain with a deep breathing. Per patient, he had CAT scan of the chest done in [facility] and he was ruled out of PE. Also endorses some chills and sweating as well as some subjective fever at home. Upon arrival to ED here, he was tachycardic, tachypneic and was saturating 86% on room air requiring 2 L of oxygen. He was once again tested positive for COVID-19. Lactic acid elevated at 2.9. Elevated inflammatory markers but unremarkable procalcitonin. Interestingly, his D-dimer is also within normal range. Patient was started on steroids and remdesivir and hospital service were consulted to admit the patient for further management. ED provider is working on getting records from [facility].  Patient admitted as above  with acute hypoxic respiratory failure in the setting of COVID-19 pneumonia.  Patient had prolonged illness prior to hospitalization, initially refusing Remdesivir and baricitinib.  Patient eventually did agree to standard of care including Remdesivir, oxygen and steroids.  Multiple lengthy discussions with patient and family about other non-FDA approved, experimental or unknown medications and therapies which we discussed would not be implemented here due to lack of data.  Patient continued to improve daily.  Patient did have episodes of hallucination and visual and auditory disturbances mostly overnight, psychiatry was involved and initially be placed patient on Seroquel, unfortunately patient has moderate medication noncompliance as outlined above and refused further doses of Seroquel stating that the Seroquel made him have hallucinations which we discussed were occurring prior to Seroquel initiation.  We did transition patient from Seroquel to Zyprexa with moderate improvement in mental status and decreased frequency of hallucinations.  Regardless patient's hypoxia improved drastically over the past 72 hours, patient is ambulating now requiring 4 L nasal cannula to maintain sats while exerting himself but otherwise stable and agreeable for discharge home.  Patient be discharged with oxygen, close follow-up with PCP in the next 1 to 2 weeks for further evaluation and treatment, continue steroid taper at discharge as well.  We discussed at length the need to quarantine for 21 days per CDC guidelines.  Patient does have sick family's at home including wife and child which we discussed as they are currently ill with COVID could quarantine with him and do not need to quarantine separately at the house.   Discharge Diagnoses:  Active Problems:   Pneumonia due to COVID-19 virus    Discharge Instructions  Discharge Instructions    Call  MD for:  difficulty breathing, headache or visual disturbances    Complete by: As directed    Diet - low sodium heart healthy   Complete by: As directed    Discharge instructions   Complete by: As directed    ?   Person Under Monitoring Name: Frank MatterJohn S Richmond  Location: [address] [place] [id number]   Infection Prevention Recommendations for Individuals Confirmed to have, or Being Evaluated for, 2019 Novel Coronavirus (COVID-19) Infection Who Receive Care at Home  Individuals who are confirmed to have, or are being evaluated for, COVID-19 should follow the prevention steps below until a healthcare provider or local or state health department says they can return to normal activities.  Stay home except to get medical care You should restrict activities outside your home, except for getting medical care. Do not go to work, school, or public areas, and do not use public transportation or taxis.  Call ahead before visiting your doctor Before your medical appointment, call the healthcare provider and tell them that you have, or are being evaluated for, COVID-19 infection. This will help the healthcare provider's office take steps to keep other people from getting infected. Ask your healthcare provider to call the local or state health department.  Monitor your symptoms Seek prompt medical attention if your illness is worsening (e.g., difficulty breathing). Before going to your medical appointment, call the healthcare provider and tell them that you have, or are being evaluated for, COVID-19 infection. Ask your healthcare provider to call the local or state health department.  Wear a facemask You should wear a facemask that covers your nose and mouth when you are in the same room with other people and when you visit a healthcare provider. People who live with or visit you should also wear a facemask while they are in the same room with you.  Separate yourself from other people in your home As much as possible, you should stay in a  different room from other people in your home. Also, you should use a separate bathroom, if available.  Avoid sharing household items You should not share dishes, drinking glasses, cups, eating utensils, towels, bedding, or other items with other people in your home. After using these items, you should wash them thoroughly with soap and water.  Cover your coughs and sneezes Cover your mouth and nose with a tissue when you cough or sneeze, or you can cough or sneeze into your sleeve. Throw used tissues in a lined trash can, and immediately wash your hands with soap and water for at least 20 seconds or use an alcohol-based hand rub.  Wash your [employer]hands Wash your hands often and thoroughly with soap and water for at least 20 seconds. You can use an alcohol-based hand sanitizer if soap and water are not available and if your hands are not visibly dirty. Avoid touching your eyes, nose, and mouth with unwashed hands.   Prevention Steps for Caregivers and Household Members of Individuals Confirmed to have, or Being Evaluated for, COVID-19 Infection Being Cared for in the Home  If you live with, or provide care at home for, a person confirmed to have, or being evaluated for, COVID-19 infection please follow these guidelines to prevent infection:  Follow healthcare provider's instructions Make sure that you understand and can help the patient follow any healthcare provider instructions for all care.  Provide for the patient's basic needs You should help the patient with basic needs in the home and  provide support for getting groceries, prescriptions, and other personal needs.  Monitor the patient's symptoms If they are getting sicker, call his or her medical provider and tell them that the patient has, or is being evaluated for, COVID-19 infection. This will help the healthcare provider's office take steps to keep other people from getting infected. Ask the healthcare provider to call the local  or state health department.  Limit the number of people who have contact with the patient If possible, have only one caregiver for the patient. Other household members should stay in another home or place of residence. If this is not possible, they should stay in another room, or be separated from the patient as much as possible. Use a separate bathroom, if available. Restrict visitors who do not have an essential need to be in the home.  Keep older adults, very young children, and other sick people away from the patient Keep older adults, very young children, and those who have compromised immune systems or chronic health conditions away from the patient. This includes people with chronic heart, lung, or kidney conditions, diabetes, and cancer.  Ensure good ventilation Make sure that shared spaces in the home have good air flow, such as from an air conditioner or an opened window, weather permitting.  Wash your hands often Wash your hands often and thoroughly with soap and water for at least 20 seconds. You can use an alcohol based hand sanitizer if soap and water are not available and if your hands are not visibly dirty. Avoid touching your eyes, nose, and mouth with unwashed hands. Use disposable paper towels to dry your hands. If not available, use dedicated cloth towels and replace them when they become wet.  Wear a facemask and gloves Wear a disposable facemask at all times in the room and gloves when you touch or have contact with the patient's blood, body fluids, and/or secretions or excretions, such as sweat, saliva, sputum, nasal mucus, vomit, urine, or feces.  Ensure the mask fits over your nose and mouth tightly, and do not touch it during use. Throw out disposable facemasks and gloves after using them. Do not reuse. Wash your hands immediately after removing your facemask and gloves. If your personal clothing becomes contaminated, carefully remove clothing and launder. Wash your  hands after handling contaminated clothing. Place all used disposable facemasks, gloves, and other waste in a lined container before disposing them with other household waste. Remove gloves and wash your hands immediately after handling these items.  Do not share dishes, glasses, or other household items with the patient Avoid sharing household items. You should not share dishes, drinking glasses, cups, eating utensils, towels, bedding, or other items with a patient who is confirmed to have, or being evaluated for, COVID-19 infection. After the person uses these items, you should wash them thoroughly with soap and water.  Wash laundry thoroughly Immediately remove and wash clothes or bedding that have blood, body fluids, and/or secretions or excretions, such as sweat, saliva, sputum, nasal mucus, vomit, urine, or feces, on them. Wear gloves when handling laundry from the patient. Read and follow directions on labels of laundry or clothing items and detergent. In general, wash and dry with the warmest temperatures recommended on the label.  Clean all areas the individual has used often Clean all touchable surfaces, such as counters, tabletops, doorknobs, bathroom fixtures, toilets, phones, keyboards, tablets, and bedside tables, every day. Also, clean any surfaces that may have blood, body fluids, and/or secretions or  excretions on them. Wear gloves when cleaning surfaces the patient has come in contact with. Use a diluted bleach solution (e.g., dilute bleach with 1 part bleach and 10 parts water) or a household disinfectant with a label that says EPA-registered for coronaviruses. To make a bleach solution at home, add 1 tablespoon of bleach to 1 quart (4 cups) of water. For a larger supply, add  cup of bleach to 1 gallon (16 cups) of water. Read labels of cleaning products and follow recommendations provided on product labels. Labels contain instructions for safe and effective use of the cleaning  product including precautions you should take when applying the product, such as wearing gloves or eye protection and making sure you have good ventilation during use of the product. Remove gloves and wash hands immediately after cleaning.  Monitor yourself for signs and symptoms of illness Caregivers and household members are considered close contacts, should monitor their health, and will be asked to limit movement outside of the home to the extent possible. Follow the monitoring steps for close contacts listed on the symptom monitoring form.   ? If you have additional questions, contact your local health department or call the epidemiologist on call at [phone] (available 24/7). ? This guidance is subject to change. For the most up-to-date guidance from [facility], please refer to their website: TripMetro.huhttps://www.cdc.gov/coronavirus/2019-ncov/hcp/guidance-prevent-spread.html   Increase activity slowly   Complete by: As directed      Allergies as of 05/04/2020   No Known Allergies     Medication List    STOP taking these medications   benazepril 10 MG tablet Commonly known as: LOTENSIN     TAKE these medications   amLODipine 5 MG tablet Commonly known as: NORVASC Take 5 mg by mouth daily.   atorvastatin 40 MG tablet Commonly known as: LIPITOR Take 40 mg by mouth at bedtime.   predniSONE 10 MG tablet Commonly known as: DELTASONE Take 4 tablets (40 mg total) by mouth daily for 3 days, THEN 3 tablets (30 mg total) daily for 3 days, THEN 2 tablets (20 mg total) daily for 3 days, THEN 1 tablet (10 mg total) daily for 3 days. Start taking on: May 04, 2020            Durable Medical Equipment  (From admission, onward)         Start     Ordered   05/04/20 1139  DME Oxygen  Once       Question Answer Comment  Length of Need 6 Months   Mode or (Route) Nasal cannula   Liters per Minute 4   Frequency Continuous (stationary and portable oxygen unit needed)   Oxygen delivery  system Gas      05/04/20 1139          No Known Allergies  Consultations: Psychiatry   Procedures/Studies: DG Chest 1 View  Result Date: 04/29/2020 CLINICAL DATA:  50 year old male respiratory distress.  COVID-19. EXAM: CHEST  1 VIEW COMPARISON:  Portable chest 04/23/2020 and earlier. FINDINGS: AP view at 0339 hours. Progressed confluence of widespread bilateral interstitial and patchy/indistinct pulmonary opacity, more extensive in the left lung. Upper lungs relatively spared. Mildly lower lung volumes. Mediastinal contours remain normal. Visualized tracheal air column is within normal limits. No pneumothorax or pleural effusion. Stable visualized osseous structures. IMPRESSION: Progressed bilateral COVID-19 pneumonia since 04/23/2020. No new te cardiopulmonary abnormality. Electronically Signed   By: Odessa FlemingH  Hall M.D.   On: 04/29/2020 04:25   CT ANGIO CHEST PE  W OR WO CONTRAST  Result Date: 04/29/2020 CLINICAL DATA:  Shortness of breath and worsening. EXAM: CT ANGIOGRAPHY CHEST WITH CONTRAST TECHNIQUE: Multidetector CT imaging of the chest was performed using the standard protocol during bolus administration of intravenous contrast. Multiplanar CT image reconstructions and MIPs were obtained to evaluate the vascular anatomy. CONTRAST:  36mL OMNIPAQUE IOHEXOL 350 MG/ML SOLN COMPARISON:  Chest x-ray 04/29/2020. CT angiography chest 04/21/2020. FINDINGS: Cardiovascular: Satisfactory opacification of the pulmonary arteries to the segmental level. No evidence of pulmonary embolism. Normal heart size. No significant pericardial effusion. The thoracic aorta is normal in caliber. No atherosclerotic plaque of the thoracic aorta. No coronary artery calcifications. Mediastinum/Nodes: Borderline enlarged mediastinal lymph nodes. No enlarged hilar or axillary lymph nodes. Thyroid gland, trachea, and esophagus demonstrate no significant findings. Small hiatal hernia. Lungs/Pleura: Multifocal consolidative and  ground-glass airspace opacities are more prominent in the lower lobes and involving the majority of the lungs. Unable to evaluate for pulmonary nodule or mass. No pleural effusion. No pneumothorax. Upper Abdomen: No acute abnormality. Musculoskeletal: No chest wall abnormality. No acute or significant osseous findings. Review of the MIP images confirms the above findings. IMPRESSION: 1. No pulmonary embolus. 2. Diffuse multifocal pneumonia consistent with COVID-19 infection. Electronically Signed   By: Tish Frederickson M.D.   On: 04/29/2020 18:12   DG Chest Port 1 View  Result Date: 04/23/2020 CLINICAL DATA:  Shortness of breath COVID Cough EXAM: PORTABLE CHEST 1 VIEW COMPARISON:  04/21/2020 FINDINGS: Cardiomediastinal silhouette and pulmonary vasculature are within normal limits. Patchy bilateral lung opacities have improved since prior examination slight are not completely resolved. IMPRESSION: Improving bilateral multifocal pneumonia. Electronically Signed   By: Acquanetta Belling M.D.   On: 04/23/2020 13:38   VAS [place] LOWER EXTREMITY VENOUS (DVT)  Result Date: 04/30/2020  Lower Venous DVT Study Other Indications: Covid+ with elevated D-dimer. Comparison Study: No prior studies Performing Technologist: Ernestene Mention  Examination Guidelines: A complete evaluation includes B-mode imaging, spectral Doppler, color Doppler, and power Doppler as needed of all accessible portions of each vessel. Bilateral testing is considered an integral part of a complete examination. Limited examinations for reoccurring indications may be performed as noted. The reflux portion of the exam is performed with the patient in reverse Trendelenburg.  +---------+---------------+---------+-----------+----------+--------------+ RIGHT    CompressibilityPhasicitySpontaneityPropertiesThrombus Aging +---------+---------------+---------+-----------+----------+--------------+ CFV      Full           Yes      Yes                                  +---------+---------------+---------+-----------+----------+--------------+ SFJ      Full                                                        +---------+---------------+---------+-----------+----------+--------------+ FV Prox  Full           Yes      Yes                                 +---------+---------------+---------+-----------+----------+--------------+ FV Mid   Full           Yes      Yes                                 +---------+---------------+---------+-----------+----------+--------------+  FV DistalFull           Yes      Yes                                 +---------+---------------+---------+-----------+----------+--------------+ PFV      Full                                                        +---------+---------------+---------+-----------+----------+--------------+ POP      Full           Yes      Yes                                 +---------+---------------+---------+-----------+----------+--------------+ PTV      Full                                                        +---------+---------------+---------+-----------+----------+--------------+ PERO     Full                                                        +---------+---------------+---------+-----------+----------+--------------+   +---------+---------------+---------+-----------+----------+--------------+ LEFT     CompressibilityPhasicitySpontaneityPropertiesThrombus Aging +---------+---------------+---------+-----------+----------+--------------+ CFV      Full           Yes      Yes                                 +---------+---------------+---------+-----------+----------+--------------+ SFJ      Full                                                        +---------+---------------+---------+-----------+----------+--------------+ FV Prox  Full           Yes      Yes                                  +---------+---------------+---------+-----------+----------+--------------+ FV Mid   Full           Yes      Yes                                 +---------+---------------+---------+-----------+----------+--------------+ FV DistalFull           Yes      Yes                                 +---------+---------------+---------+-----------+----------+--------------+ PFV      Full                                                        +---------+---------------+---------+-----------+----------+--------------+  POP      Full           Yes      Yes                                 +---------+---------------+---------+-----------+----------+--------------+ PTV      Full                                                        +---------+---------------+---------+-----------+----------+--------------+ PERO     Full                                                        +---------+---------------+---------+-----------+----------+--------------+     Summary: BILATERAL: - No evidence of deep vein thrombosis seen in the lower extremities, bilaterally. - RIGHT: - No cystic structure found in the popliteal fossa.  LEFT: - No cystic structure found in the popliteal fossa.  *See table(s) above for measurements and observations. Electronically signed by Waverly Ferrari MD on 04/30/2020 at 1:41:02 PM.    Final      Subjective: No acute issues or events overnight   Discharge Exam: Vitals:   05/03/20 2119 05/04/20 0514  BP: 120/85 117/78  Pulse: 92 85  Resp: 16 14  Temp: 99 F (37.2 C) 98.1 F (36.7 C)  SpO2: 92% 91%   Vitals:   05/03/20 0621 05/03/20 1220 05/03/20 2119 05/04/20 0514  BP: 122/75 110/72 120/85 117/78  Pulse: 79 (!) 103 92 85  Resp: 16 20 16 14   Temp: 98.9 F (37.2 C) 98.9 F (37.2 C) 99 F (37.2 C) 98.1 F (36.7 C)  TempSrc: Oral     SpO2: 90% 90% 92% 91%  Weight:      Height:        General: Pt is alert, awake, not in acute  distress Cardiovascular: RRR, S1/S2 +, no rubs, no gallops Respiratory: CTA bilaterally, no wheezing, no rhonchi Abdominal: Soft, NT, ND, bowel sounds + Extremities: no edema, no cyanosis    The results of significant diagnostics from this hospitalization (including imaging, microbiology, ancillary and laboratory) are listed below for reference.     Microbiology: No results found for this or any previous visit (from the past 240 hour(s)).   Labs: BNP (last 3 results) No results for input(s): BNP in the last 8760 hours. Basic Metabolic Panel: Recent Labs  Lab 04/30/20 0410 05/01/20 1345 05/02/20 0703 05/03/20 0447 05/04/20 0500  NA 134* 136 136 137 135  K 4.5 3.8 3.5 3.6 3.9  CL 99 98 103 103 101  CO2 27 25 24 25  21*  GLUCOSE 123* 61* 107* 103* 112*  BUN 16 15 15 14 16   CREATININE 1.03 1.07 1.02 1.01 1.00  CALCIUM 7.9* 7.7* 7.7* 7.7* 8.2*   Liver Function Tests: Recent Labs  Lab 04/30/20 0410 05/01/20 1345 05/02/20 0703 05/03/20 0447 05/04/20 0500  AST 27 30 33 44* 37  ALT 37 42 65* 109* 139*  ALKPHOS 61 64 59 62 60  BILITOT 0.8 0.8 1.0 0.6 0.8  PROT 6.1* 5.9* 5.6* 5.7* 6.2*  ALBUMIN 2.3* 2.2* 2.0* 2.1*  2.3*   No results for input(s): LIPASE, AMYLASE in the last 168 hours. No results for input(s): AMMONIA in the last 168 hours. CBC: Recent Labs  Lab 04/28/20 0447 04/29/20 0122 04/30/20 0410 05/01/20 1345 05/02/20 0703 05/03/20 0447 05/04/20 0500  WBC 12.0*   < > 11.8* 12.3* 11.3* 13.2* 18.5*  NEUTROABS 9.6*  --   --   --   --   --   --   HGB 14.3   < > 13.1 12.8* 12.2* 12.6* 13.3  HCT 42.9   < > 40.4 40.4 35.2* 38.7* 39.2  MCV 84.6   < > 84.9 86.9 82.8 85.8 84.1  PLT 293   < > 219 273 264 291 346   < > = values in this interval not displayed.   Cardiac Enzymes: No results for input(s): CKTOTAL, CKMB, CKMBINDEX, TROPONINI in the last 168 hours. BNP: Invalid input(s): POCBNP CBG: Recent Labs  Lab 05/03/20 0013  GLUCAP 209*   D-Dimer Recent  Labs    05/03/20 0447 05/04/20 0500  DDIMER 2.44* 1.99*   Hgb A1c No results for input(s): HGBA1C in the last 72 hours. Lipid Profile No results for input(s): CHOL, HDL, LDLCALC, TRIG, CHOLHDL, LDLDIRECT in the last 72 hours. Thyroid function studies No results for input(s): TSH, T4TOTAL, T3FREE, THYROIDAB in the last 72 hours.  Invalid input(s): FREET3 Anemia work up No results for input(s): VITAMINB12, FOLATE, FERRITIN, TIBC, IRON, RETICCTPCT in the last 72 hours. Urinalysis No results found for: COLORURINE, APPEARANCEUR, LABSPEC, PHURINE, GLUCOSEU, HGBUR, BILIRUBINUR, KETONESUR, PROTEINUR, UROBILINOGEN, NITRITE, LEUKOCYTESUR Sepsis Labs Invalid input(s): PROCALCITONIN,  WBC,  LACTICIDVEN Microbiology No results found for this or any previous visit (from the past 240 hour(s)).   Time coordinating discharge: Over 30 minutes  SIGNED:   Azucena Fallen, DO [facility] 05/04/2020, 11:39 AM Pager   If 7PM-7AM, please contact night-coverage [email or web address]

## 2020-05-04 NOTE — TOC Transition Note (Signed)
Transition of Care [facility]) - CM/SW Discharge Note   Patient Details  Name: Frank Richmond MRN: [id number] Date of Birth: 1970/05/04  Transition of Care [facility]) CM/SW Contact:  Lawerance Sabal, RN Phone Number: 05/04/2020, 12:06 PM   Clinical Narrative:   Home oxygen to be delivered to room by [employer], then set up later today at home. Patient and wife both in agreement with plan. Nurse notified by secure chat.     Final next level of care: Home/Self Care Barriers to Discharge: No Barriers Identified   Patient Goals and CMS Choice        Discharge Placement                       Discharge Plan and Services                DME Arranged: Oxygen DME Agency:  [facility]) Date DME Agency Contacted: 05/04/20 Time DME Agency Contacted: 1206 Representative spoke with at DME Agency: Orvan July            Social Determinants of Health (SDOH) Interventions     Readmission Risk Interventions No flowsheet data found.

## 2020-05-04 NOTE — Progress Notes (Signed)
Ambulated patient in hall. Prior to ambulation patient saturation was 89% on 2L [place]. During ambulation saturation dropped to 84% on 2L. [place]. After ambulation patient was placed on 3L [place] to recover to 88%.

## 2020-05-04 NOTE — Plan of Care (Signed)
  Problem: Education: Goal: Knowledge of risk factors and measures for prevention of condition will improve Outcome: Progressing   Problem: Coping: Goal: Psychosocial and spiritual needs will be supported Outcome: Progressing   Problem: Respiratory: Goal: Will maintain a patent airway Outcome: Progressing Goal: Complications related to the disease process, condition or treatment will be avoided or minimized Outcome: Progressing   

## 2020-05-06 ENCOUNTER — Other Ambulatory Visit: Payer: Self-pay

## 2020-05-06 ENCOUNTER — Other Ambulatory Visit: Payer: Self-pay | Admitting: *Deleted

## 2020-05-06 NOTE — Patient Outreach (Signed)
[facility]) Care Management  05/06/2020  AZELL BILL [birth year] [id number]  Discharged 05/04/2020 Referral Received 05/06/2020 Initial Outreach 05/06/2020  RN attempted outreach today however unsuccessful. RN able to leave a HIPAA approved voice message requesting a call back. Will further engage at that time.  Will scheduled another outreach call over the next week.  Elliot Cousin, RN Care Management Coordinator [facility] Main Office [phone]

## 2020-05-09 ENCOUNTER — Other Ambulatory Visit: Payer: Self-pay | Admitting: *Deleted

## 2020-05-09 NOTE — Patient Outreach (Signed)
[facility]) Care Management  05/09/2020  DANUEL FELICETTI 07-Dec-1970 [id number]  Referral Received  1/24 Hospital discharged 1/22  Telephone Assessment-Declined [facility]  RN spoke with pt in detail today concerning the purpose for today's call. Pt had several inquires pending a office visit with his established provided Dr. Juleen China at [facility] [place]. RN address each inquired however encouraged pt to speak with his provider since the questions were related to his covid symptoms.  RN further introduce services available via [facility] with Child psychotherapist and pharmacy. Explained the benefits of [facility] program and how to improve some of his medical conditions to lower the risk of hospitalizations. Pt verbalize an understanding however opt to decline [facility] services at this time indicating he was managing his symptoms and medical issues very well and does not need [facility] services.   RN offered to follow up at another time and send information packet to the home address that would further detail [facility] services however pt opt to declined. RN provided contact information on who to get in touch with [facility] if future services are requested. No further inquire or questions at this time. Will notify the provider of pt's decline for [facility] services.  Elliot Cousin, RN Care Management Coordinator [facility] Main Office [phone]

## 2020-05-24 ENCOUNTER — Encounter: Payer: Self-pay | Admitting: Pulmonary Disease

## 2020-06-20 ENCOUNTER — Ambulatory Visit (INDEPENDENT_AMBULATORY_CARE_PROVIDER_SITE_OTHER): Payer: 59 | Admitting: Pulmonary Disease

## 2020-06-20 ENCOUNTER — Other Ambulatory Visit: Payer: Self-pay

## 2020-06-20 ENCOUNTER — Encounter: Payer: Self-pay | Admitting: Pulmonary Disease

## 2020-06-20 ENCOUNTER — Ambulatory Visit (INDEPENDENT_AMBULATORY_CARE_PROVIDER_SITE_OTHER): Payer: 59

## 2020-06-20 VITALS — BP 132/80 | HR 106 | Temp 99.1°F | Ht 67.0 in | Wt 194.4 lb

## 2020-06-20 DIAGNOSIS — G4733 Obstructive sleep apnea (adult) (pediatric): Secondary | ICD-10-CM

## 2020-06-20 DIAGNOSIS — J1282 Pneumonia due to coronavirus disease 2019: Secondary | ICD-10-CM

## 2020-06-20 DIAGNOSIS — R0683 Snoring: Secondary | ICD-10-CM

## 2020-06-20 DIAGNOSIS — U071 COVID-19: Secondary | ICD-10-CM

## 2020-06-20 NOTE — Assessment & Plan Note (Signed)
Very high probability for OSA given loud snoring, witnessed apneas and nonrefreshing sleep We will proceed with home sleep test If this shows significant OSA, we may offer him a formal CPAP titration study given his poor tolerance of CPAP during his hospital stay  The pathophysiology of obstructive sleep apnea , it's cardiovascular consequences & modes of treatment including CPAP were discused with the patient in detail & they evidenced understanding.

## 2020-06-20 NOTE — Patient Instructions (Signed)
CXR today Home sleep test

## 2020-06-20 NOTE — Progress Notes (Signed)
Subjective:    Patient ID: Frank Richmond, male    DOB: June 06, 1970, 50 y.o.   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  He reports snoring for many years due to which he sleeps in a different room from his wife.  She has noted gasping and choking episodes in his sleep and witnessed apneas. This was especially more pronounced when he was admitted to the hospital January 2022 for Covid pneumonia and acute respiratory failure.  He required high flow nasal cannula and on reviewing discharge summary, it seems that he had hallucinations and delirium requiring Seroquel and Zyprexa.  Patient himself feels that he was given psychotropic medications without his consent and this made his illness worse and made him have hallucinations.  At any rate he was discharged with oxygen and was able to come off oxygen within a few days of discharge.  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  He was noted to desaturate to 89 to 91% on walking  He reports that his uvula gets "sucked into his sinus during sleep". Epworth sleepiness score is 7 and he reports some sleepiness while sitting and reading or lying down to rest in the afternoons. Bedtime is between 10:11 PM, sleep latency is 5 to 10 minutes, he generally sleeps on his side but after his recent Covid illness he has also been sleeping on his stomach.  Wife is noted increase snoring on his back.  He reports 2-3 nocturnal awakenings including nocturia and is out of bed between 7 and 8 AM with dryness of mouth but denies headaches.  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 Acute hepatitis B    s/p resolution (wife from [place] is a carrier)    Past Surgical History:  Procedure Laterality Date  . NASAL SINUS SURGERY      Allergies  Allergen Reactions  . Cephalexin Hives and Rash    Social History   Socioeconomic History  . Marital status: Single    Spouse name: Not on file  . Number of children: Not on file  . Years of education: Not on file  . Highest education level: Not on file  Occupational History  . Not on file  Tobacco Use  . Smoking status: Former Smoker    Packs/day: 1.00    Years: 25.00    Pack years: 25.00    Types: Cigarettes    Start date: 78    Quit date: 12/17/2006    Years since quitting: 13.5  . Smokeless tobacco: Never Used  Vaping Use  . Vaping Use: Never used  Substance and Sexual Activity  . Alcohol use: Not on file  . Drug use: Not on file  . Sexual activity: Not on file  Other Topics Concern  . Not on file  Social History Narrative  . Not on file   Social Determinants  of Health   Financial Resource Strain: Not on file  Food Insecurity: Not on file  Transportation Needs: Not on file  Physical Activity: Not on file  Stress: Not on file  Social Connections: Not on file  Intimate Partner Violence: Not on file   He is married, lives with his wife and son in [place]. He works as an Midwife and runs its own business. He quit smoking 13 years ago  History reviewed. No pertinent family history.   Review of Systems Constitutional: negative for anorexia, fevers and sweats  Eyes: negative for irritation, redness and visual disturbance  Ears, nose, mouth, throat, and face: negative for earaches, epistaxis, nasal congestion and  sore throat  Respiratory: negative for cough, dyspnea on exertion, sputum and wheezing  Cardiovascular: negative for chest pain, dyspnea, lower extremity edema, orthopnea, palpitations and syncope  Gastrointestinal: negative for abdominal pain, constipation, diarrhea, melena, nausea and vomiting  Genitourinary:negative for dysuria, frequency and hematuria  Hematologic/lymphatic: negative for bleeding, easy bruising and lymphadenopathy  Musculoskeletal:negative for arthralgias, muscle weakness and stiff joints  Neurological: negative for coordination problems, gait problems, headaches and weakness  Endocrine: negative for diabetic symptoms including polydipsia, polyuria and weight loss     Objective:   Physical Exam  Gen. Pleasant, obese, in no distress, normal affect ENT - no pallor,icterus, no post nasal drip, class 2- airway, long uvula Neck: No JVD, no thyromegaly, no carotid bruits Lungs: no use of accessory muscles, no dullness to percussion, decreased without rales or rhonchi  Cardiovascular: Rhythm regular, heart sounds  normal, no murmurs or gallops, no peripheral edema Abdomen: soft and non-tender, no hepatosplenomegaly, BS normal. Musculoskeletal: No deformities, no cyanosis or clubbing Neuro:  alert, non focal, no tremors       Assessment & Plan:

## 2020-06-20 NOTE — Assessment & Plan Note (Signed)
Chest x-ray seems significantly improved today, I independently reviewed. Hypoxia resolved

## 2020-06-24 NOTE — Progress Notes (Signed)
Called and went over xray results per Dr Alva with patient. All questions answered and patient expressed full understanding. Nothing further needed at this time.

## 2021-11-22 IMAGING — CT CT ANGIO CHEST
2 of 6 series · 19 of 46 positions shown · IV contrast (omnipaque)
Comparison: Chest x-ray 04/29/2020. CT angiography chest
04/21/2020.

CLINICAL DATA: Shortness of breath and worsening.

EXAM:
CT ANGIOGRAPHY CHEST WITH CONTRAST
TECHNIQUE: Multidetector CT imaging of the chest was performed using the
standard protocol during bolus administration of intravenous
contrast. Multiplanar CT image reconstructions and MIPs were
obtained to evaluate the vascular anatomy.
CONTRAST:  75mL OMNIPAQUE IOHEXOL 350 MG/ML SOLN

[Series 6: thins · axial · 0.76mm/px · z∈[+1035,+1322]mm · 16 of 315 slices shown]
[im 14/315  lung]
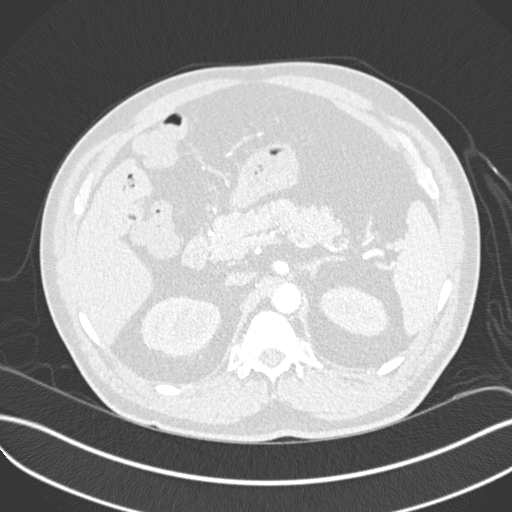
[im 41/315  soft-tissue]
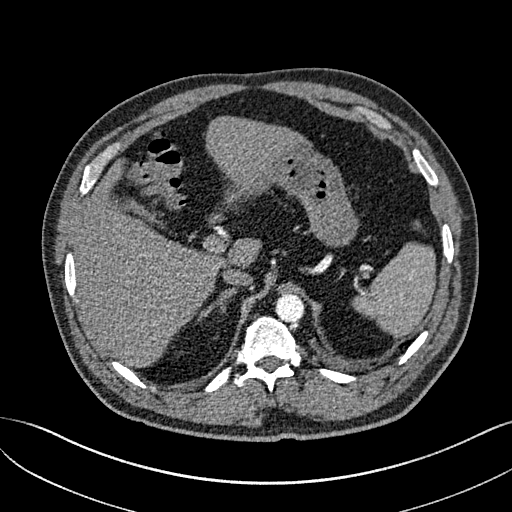
[im 55/315  lung]
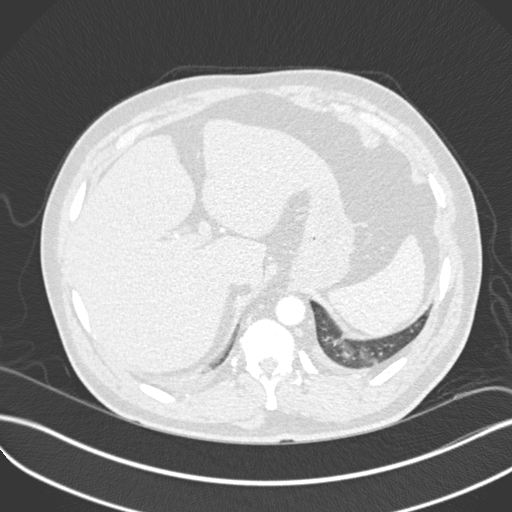
[im 69/315  soft-tissue]
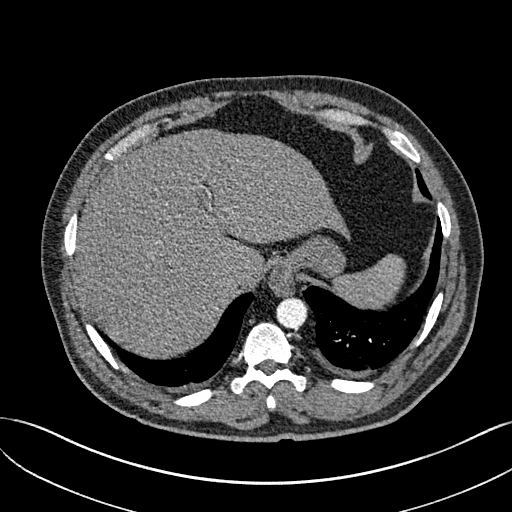
[im 96/315  lung]
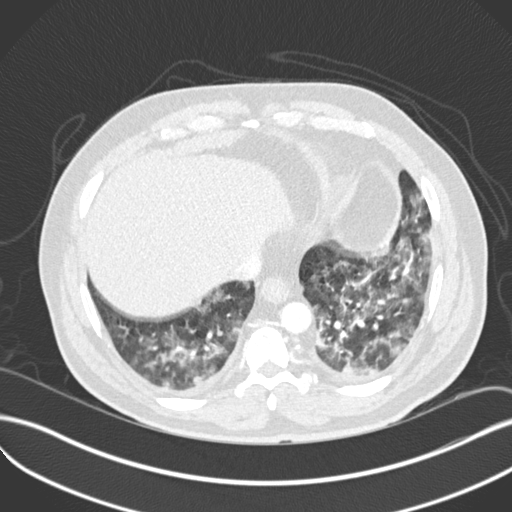
[im 110/315  soft-tissue]
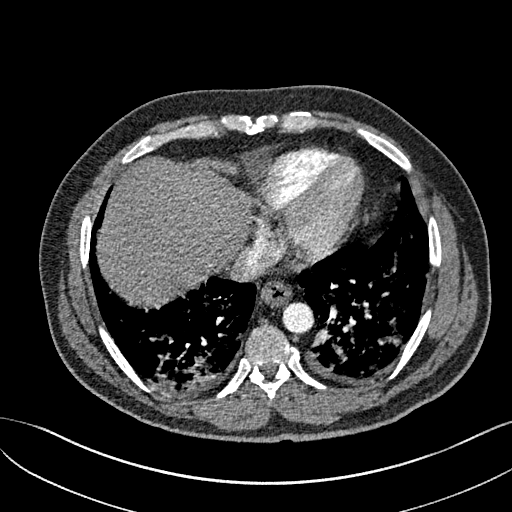
[im 123/315  lung]
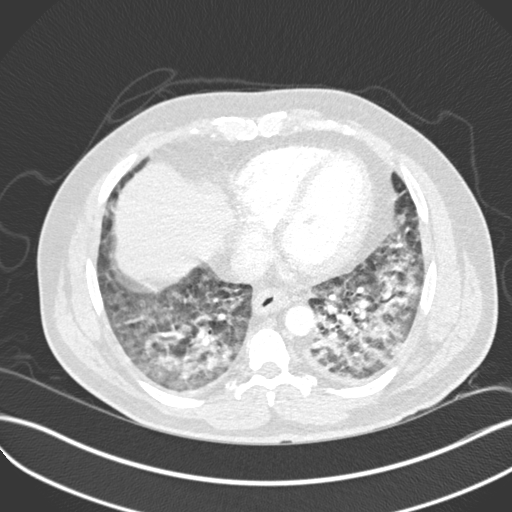
[im 151/315  soft-tissue]
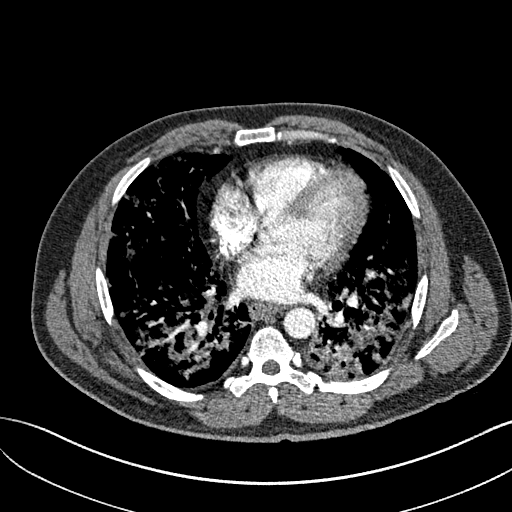
[im 164/315  lung]
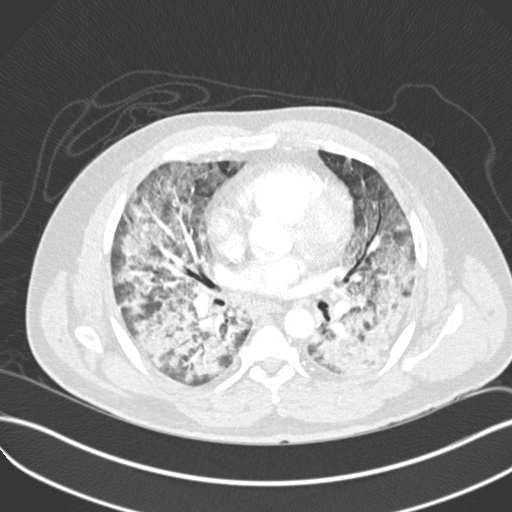
[im 192/315  soft-tissue]
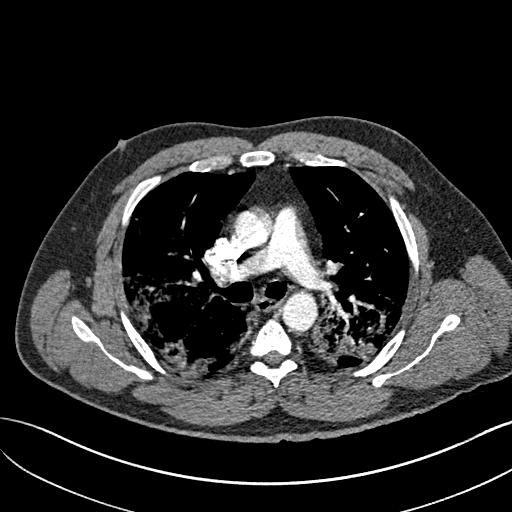
[im 205/315  lung]
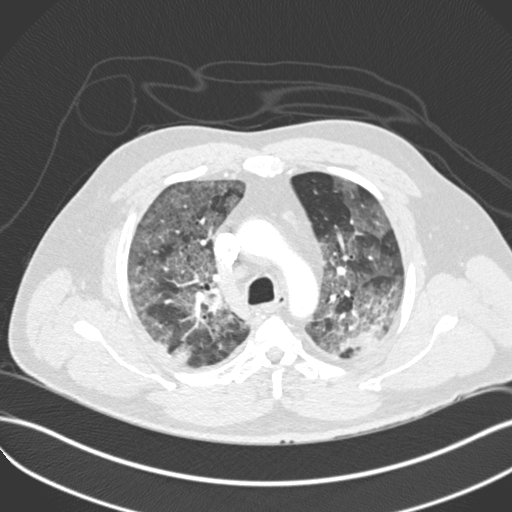
[im 219/315  soft-tissue]
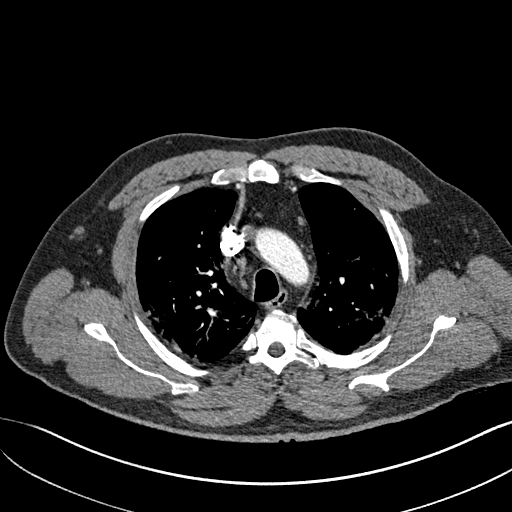
[im 246/315  lung]
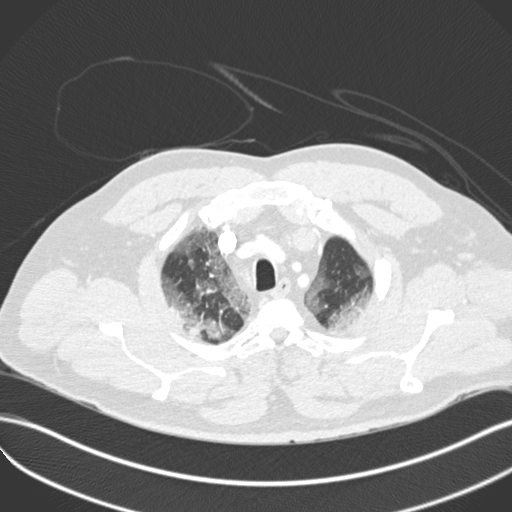
[im 260/315  soft-tissue]
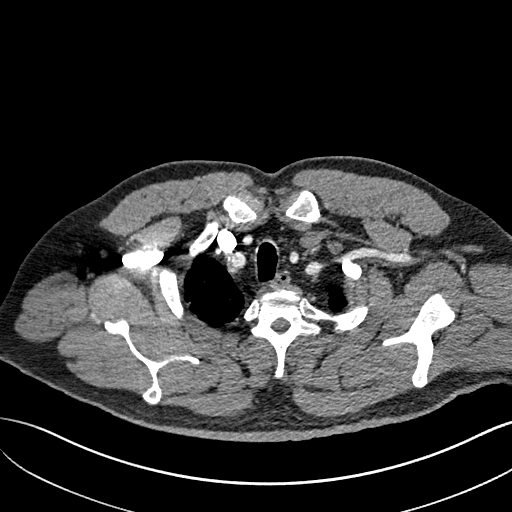
[im 274/315  lung]
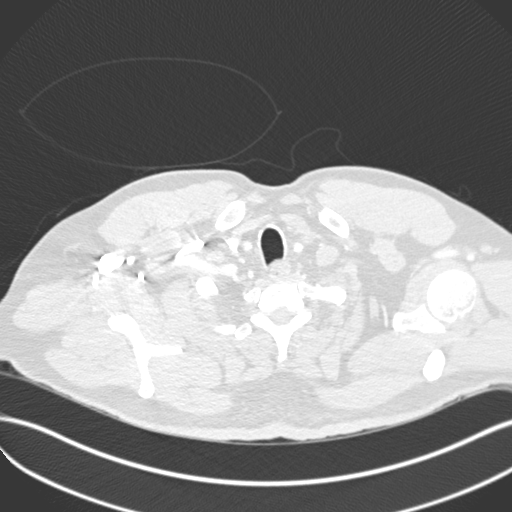
[im 301/315  soft-tissue]
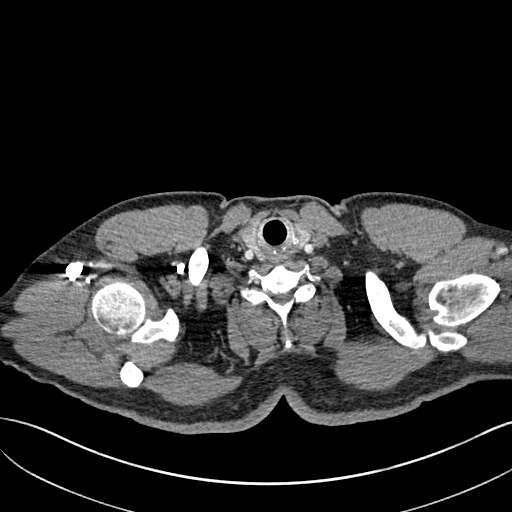

[Series 8: coronal mpr · coronal · 0.68mm/px · 3 of 156 slices shown]
[im 39/156  soft-tissue]
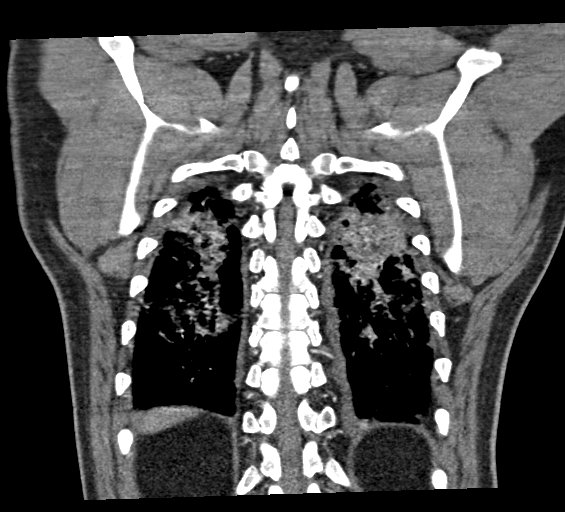
[im 78/156  soft-tissue]
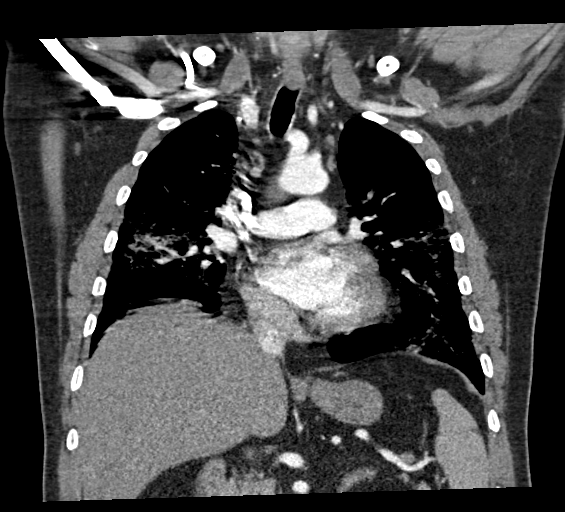
[im 117/156  soft-tissue]
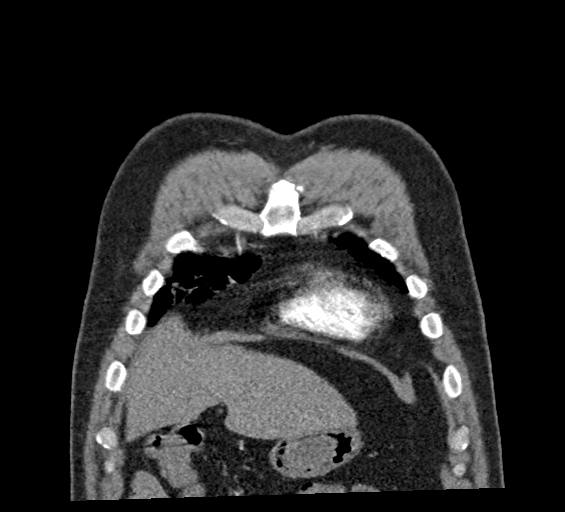

[19 of 46 positions shown; findings below may reference images not displayed]

FINDINGS: Cardiovascular: Satisfactory opacification of the pulmonary arteries
to the segmental level. No evidence of pulmonary embolism. Normal
heart size. No significant pericardial effusion. The thoracic aorta
is normal in caliber. No atherosclerotic plaque of the thoracic
aorta. No coronary artery calcifications.

Mediastinum/Nodes: Borderline enlarged mediastinal lymph nodes. No
enlarged hilar or axillary lymph nodes. Thyroid gland, trachea, and
esophagus demonstrate no significant findings. Small hiatal hernia.

Lungs/Pleura: Multifocal consolidative and ground-glass airspace
opacities are more prominent in the lower lobes and involving the
majority of the lungs. Unable to evaluate for pulmonary nodule or
mass. No pleural effusion. No pneumothorax.

Upper Abdomen: No acute abnormality.

Musculoskeletal: No chest wall abnormality. No acute or significant
osseous findings.

Review of the MIP images confirms the above findings.
IMPRESSION: 1. No pulmonary embolus.
2. Diffuse multifocal pneumonia consistent with [id number] infection.

## 2022-01-13 IMAGING — DX DG CHEST 2V
2 series · 2 of 2 positions shown · non-contrast
Comparison: Chest radiograph and chest CT April 29, 2020

CLINICAL DATA: Recent pneumonia

EXAM:
CHEST - 2 VIEW

[chest pa]
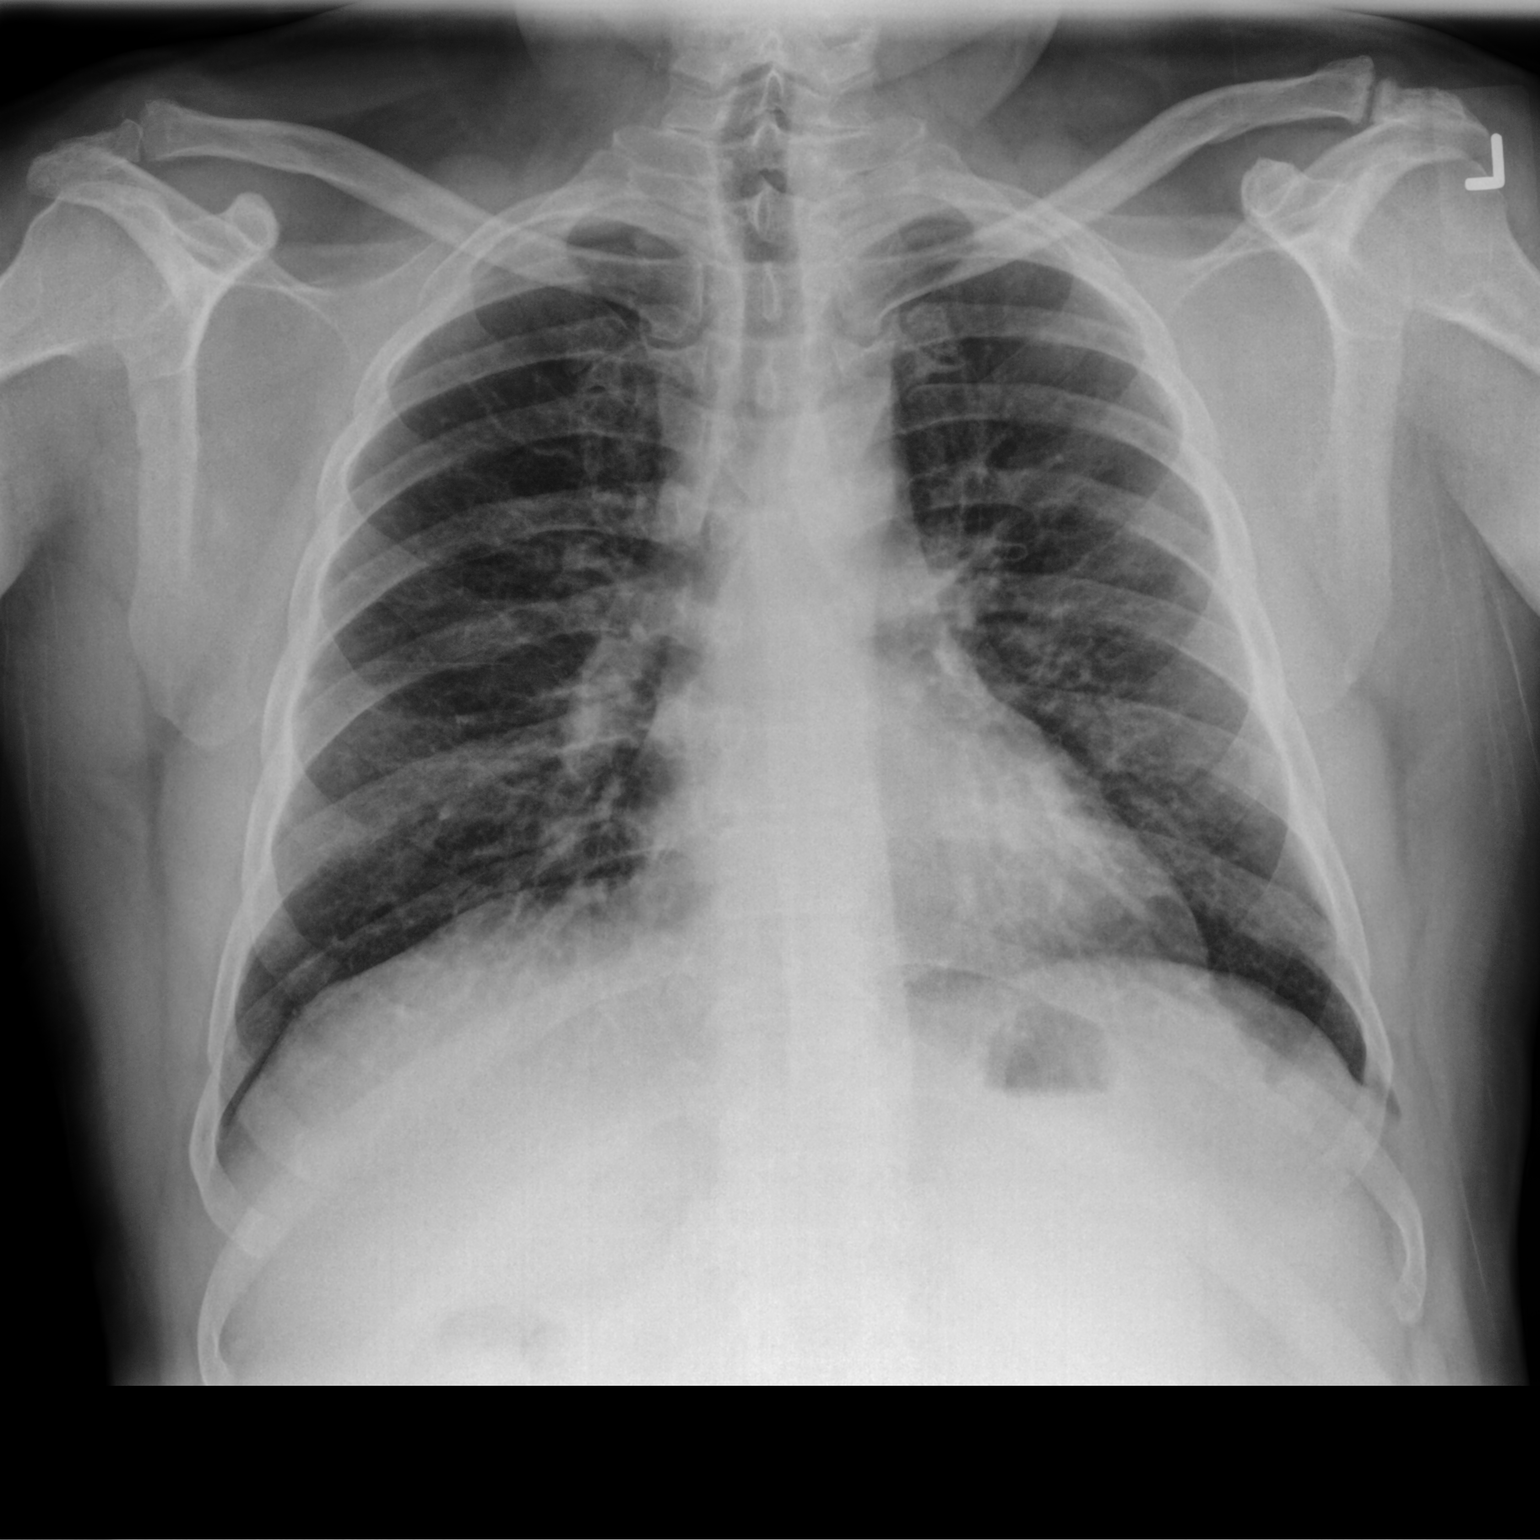

[chest lat]
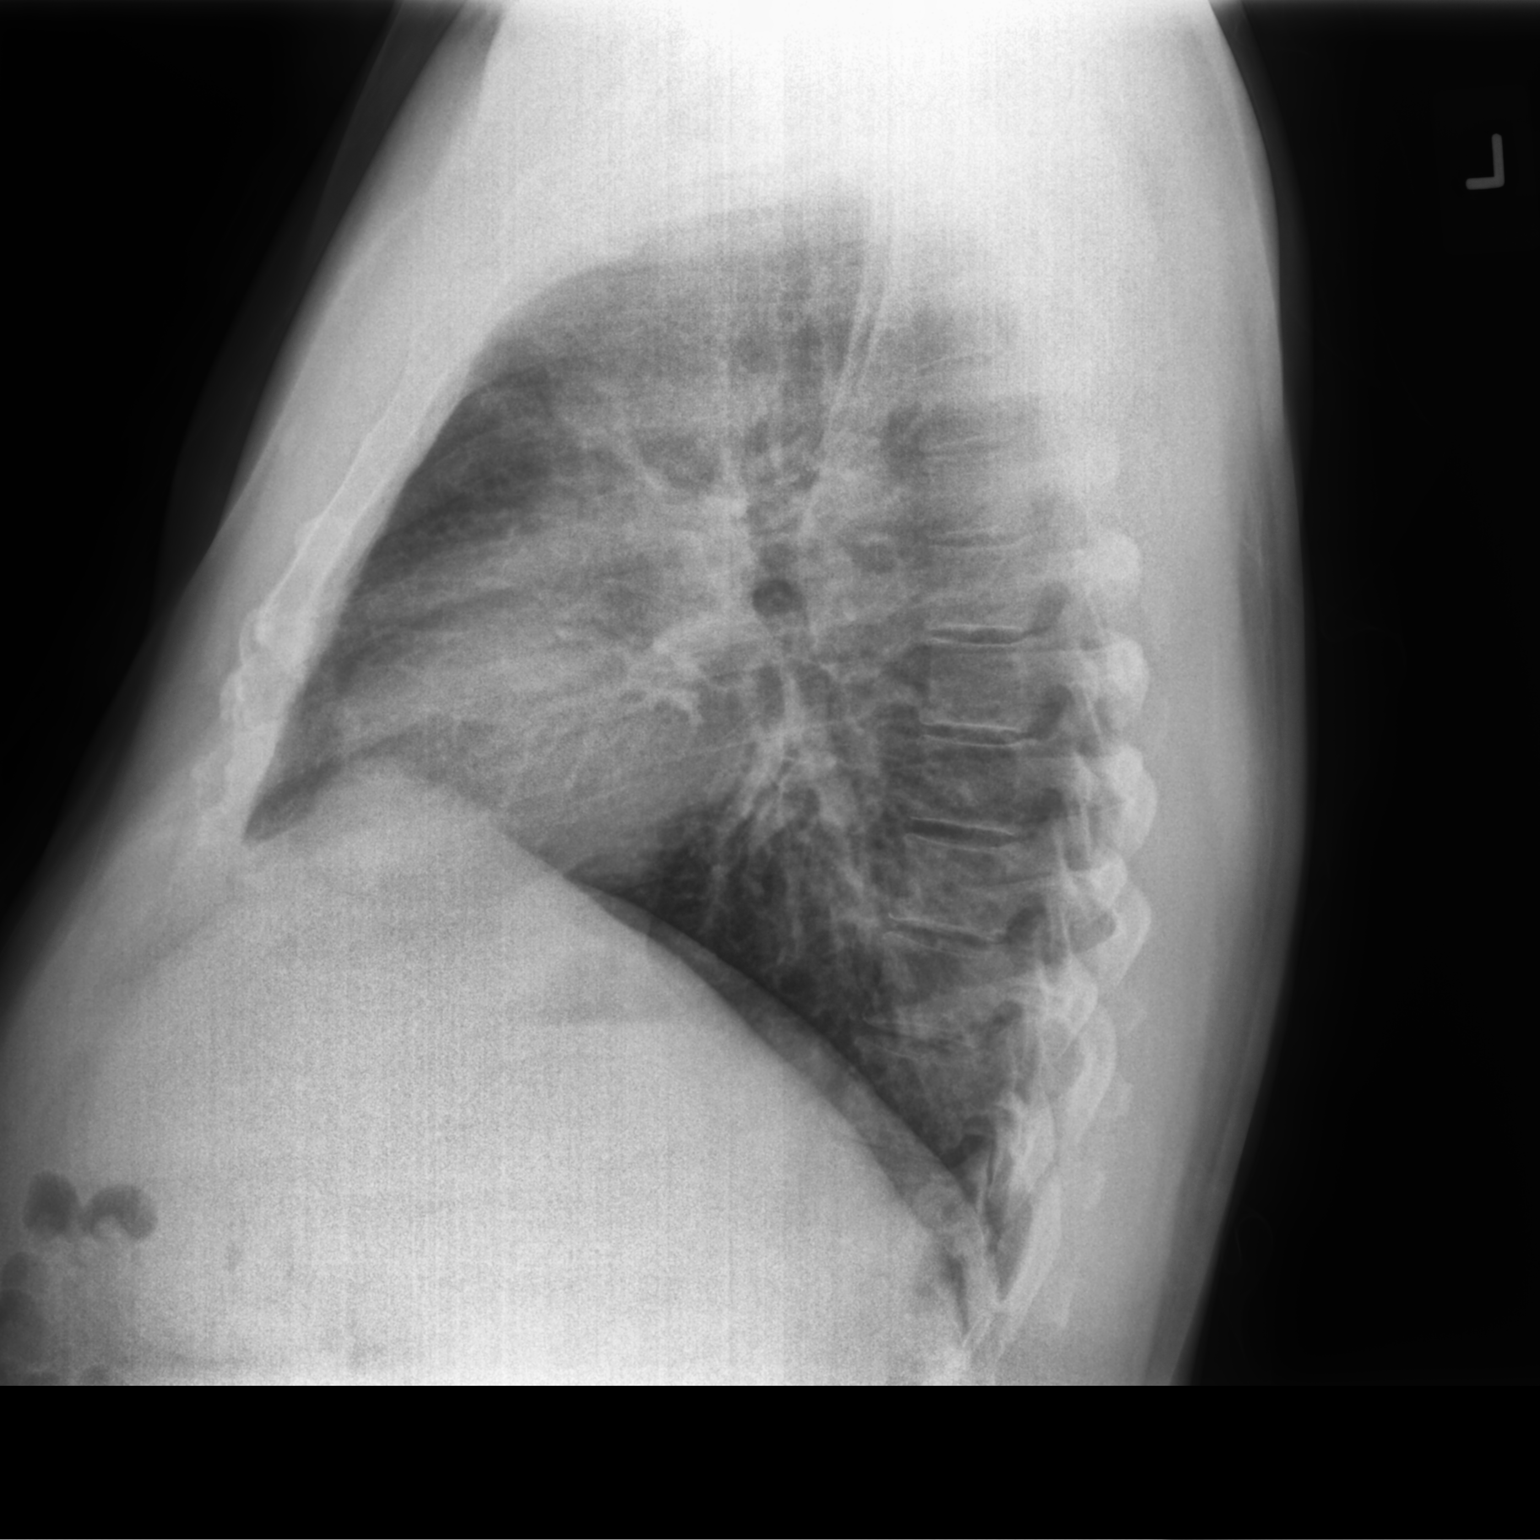

[2 of 2 positions shown; findings below may reference images not displayed]

FINDINGS: Lungs are now clear. Heart size and pulmonary vascularity are
normal. No adenopathy. No bone lesions.
IMPRESSION: Interval clearing of infiltrate with lungs now clear. Heart size
normal.
